# Patient Record
Sex: Female | Born: 1980 | Race: White | Hispanic: No | Marital: Single | Smoking: Former smoker
Health system: Southern US, Community
[De-identification: ages and names within clinical notes are randomized; demographics above are authoritative.]

## PROBLEM LIST (undated history)

## (undated) DIAGNOSIS — R011 Cardiac murmur, unspecified: Secondary | ICD-10-CM

## (undated) DIAGNOSIS — E559 Vitamin D deficiency, unspecified: Secondary | ICD-10-CM

## (undated) DIAGNOSIS — G40909 Epilepsy, unspecified, not intractable, without status epilepticus: Secondary | ICD-10-CM

## (undated) DIAGNOSIS — K602 Anal fissure, unspecified: Secondary | ICD-10-CM

## (undated) DIAGNOSIS — T7840XA Allergy, unspecified, initial encounter: Secondary | ICD-10-CM

## (undated) DIAGNOSIS — D649 Anemia, unspecified: Secondary | ICD-10-CM

## (undated) DIAGNOSIS — R7303 Prediabetes: Secondary | ICD-10-CM

## (undated) DIAGNOSIS — O009 Unspecified ectopic pregnancy without intrauterine pregnancy: Secondary | ICD-10-CM

## (undated) DIAGNOSIS — Z5189 Encounter for other specified aftercare: Secondary | ICD-10-CM

## (undated) HISTORY — DX: Encounter for other specified aftercare: Z51.89

## (undated) HISTORY — DX: Vitamin D deficiency, unspecified: E55.9

## (undated) HISTORY — DX: Prediabetes: R73.03

## (undated) HISTORY — DX: Anal fissure, unspecified: K60.2

## (undated) HISTORY — DX: Allergy, unspecified, initial encounter: T78.40XA

## (undated) HISTORY — DX: Anemia, unspecified: D64.9

## (undated) HISTORY — DX: Unspecified ectopic pregnancy without intrauterine pregnancy: O00.90

---

## 2000-04-14 ENCOUNTER — Emergency Department (HOSPITAL_COMMUNITY): Admission: EM | Admit: 2000-04-14 | Discharge: 2000-04-15 | Payer: Self-pay | Admitting: Emergency Medicine

## 2002-08-11 ENCOUNTER — Other Ambulatory Visit: Admission: RE | Admit: 2002-08-11 | Discharge: 2002-08-11 | Payer: Self-pay | Admitting: Obstetrics and Gynecology

## 2007-11-16 ENCOUNTER — Ambulatory Visit: Payer: Self-pay | Admitting: Family Medicine

## 2009-02-08 ENCOUNTER — Ambulatory Visit: Payer: Self-pay | Admitting: Family Medicine

## 2010-03-22 ENCOUNTER — Ambulatory Visit: Payer: Self-pay | Admitting: Family Medicine

## 2010-03-27 ENCOUNTER — Ambulatory Visit
Admission: RE | Admit: 2010-03-27 | Discharge: 2010-03-27 | Payer: Self-pay | Source: Home / Self Care | Attending: Family Medicine | Admitting: Family Medicine

## 2010-10-29 ENCOUNTER — Other Ambulatory Visit: Payer: Self-pay | Admitting: *Deleted

## 2010-10-29 ENCOUNTER — Telehealth: Payer: Self-pay | Admitting: Family Medicine

## 2010-10-29 ENCOUNTER — Other Ambulatory Visit: Payer: BC Managed Care – PPO

## 2010-10-29 DIAGNOSIS — N390 Urinary tract infection, site not specified: Secondary | ICD-10-CM

## 2010-10-29 DIAGNOSIS — R300 Dysuria: Secondary | ICD-10-CM

## 2010-10-29 LAB — POCT URINALYSIS DIPSTICK
Bilirubin, UA: NEGATIVE
Blood, UA: POSITIVE
Glucose, UA: NEGATIVE
Leukocytes, UA: NEGATIVE
Nitrite, UA: POSITIVE
Urobilinogen, UA: NEGATIVE
pH, UA: 8

## 2010-10-29 MED ORDER — NITROFURANTOIN MONOHYD MACRO 100 MG PO CAPS: 100.0000 mg | ORAL_CAPSULE | Freq: Two times a day (BID) | ORAL | Status: AC

## 2010-10-29 NOTE — Telephone Encounter (Signed)
Pt has UTI and macrobid 100 mg 1 po BID #20 with no refills was called in  to [facility] at [phone].  CM, LPN

## 2010-10-29 NOTE — Telephone Encounter (Signed)
Called pt to inform her she

## 2010-10-29 NOTE — Telephone Encounter (Signed)
She at least needs to bring by a urine specimen

## 2011-02-21 ENCOUNTER — Encounter: Payer: Self-pay | Admitting: Family Medicine

## 2011-03-20 ENCOUNTER — Ambulatory Visit (INDEPENDENT_AMBULATORY_CARE_PROVIDER_SITE_OTHER): Payer: BC Managed Care – PPO | Admitting: Family Medicine

## 2011-03-20 ENCOUNTER — Encounter: Payer: Self-pay | Admitting: Family Medicine

## 2011-03-20 VITALS — BP 100/70 | HR 74 | Ht 64.0 in | Wt 129.0 lb

## 2011-03-20 DIAGNOSIS — N912 Amenorrhea, unspecified: Secondary | ICD-10-CM

## 2011-03-20 LAB — POCT URINE PREGNANCY: Preg Test, Ur: POSITIVE

## 2011-03-20 NOTE — Progress Notes (Signed)
  Subjective:    Patient ID: Courtney Wheeler, female    DOB: 09-Jun-1980, 30 y.o.   MRN: [id number]  HPI She is here for verification of a recent urine pregnancy test at home which was positive. She is normally very regular. She has had difficulty with birth control causing side effects.   Review of Systems     Objective:   Physical Exam Alert and in no distress. Urine hCG test was positive.       Assessment & Plan:  Amenorrhea, probable pregnancy. Serum pregnancy test will be ordered. Discussed followup and she would like to have the pregnancy terminated. We will make appropriate referral when this occurs.

## 2011-03-20 NOTE — Progress Notes (Signed)
Addended by: Lavell Islam on: 03/20/2011 09:48 AM   Modules accepted: Orders

## 2011-03-24 ENCOUNTER — Telehealth: Payer: Self-pay | Admitting: Family Medicine

## 2011-03-24 NOTE — Telephone Encounter (Signed)
Per Dr. Susann Givens contacted patient concerning her request to terminate pregnancy. She states she has scheduled appointment with [facility] on [address] but would prefer to go thru her OBGYN, Dr. Rosemary Holms if possible. Contacted Dr. Damian Leavell office and spoke to his nurse,Tracy, and asked if this was a service he provided or how did they handle. She states they handle these on a case by case basis and schedule the patient to come in to see the physician. Scheduled patient to see Dr. Rosemary Holms on Thursday, January 3,2013 at 2:45. Pt informed of appointment.

## 2011-03-25 DIAGNOSIS — O009 Unspecified ectopic pregnancy without intrauterine pregnancy: Secondary | ICD-10-CM

## 2011-03-25 HISTORY — PX: DILATION AND CURETTAGE OF UTERUS: SHX78

## 2011-03-25 HISTORY — DX: Unspecified ectopic pregnancy without intrauterine pregnancy: O00.90

## 2011-03-27 ENCOUNTER — Telehealth: Payer: Self-pay | Admitting: Family Medicine

## 2011-03-27 NOTE — Telephone Encounter (Signed)
Pt informed

## 2011-03-27 NOTE — Telephone Encounter (Signed)
Courtney Wheeler said it was to late to do this

## 2011-03-27 NOTE — Telephone Encounter (Signed)
Saw Dr. Rosemary Holms yesterday they did not find embryo. Want to add quantative pregnancy test to blood from 12/27 to compare the levels  Please call patient, per patient Dr. Damian Leavell office is suppose to call also

## 2011-03-27 NOTE — Telephone Encounter (Signed)
Let patient know that we could not do this do to technical reasons

## 2011-03-27 NOTE — Telephone Encounter (Signed)
Have Alvino Chapel add this to the bloodwork that we already did

## 2011-03-31 ENCOUNTER — Encounter: Payer: BC Managed Care – PPO | Admitting: Family Medicine

## 2011-04-17 ENCOUNTER — Other Ambulatory Visit: Payer: Self-pay

## 2011-04-17 ENCOUNTER — Encounter: Payer: Self-pay | Admitting: Medical

## 2011-04-17 ENCOUNTER — Inpatient Hospital Stay (HOSPITAL_COMMUNITY)
Admission: EM | Admit: 2011-04-17 | Discharge: 2011-04-19 | DRG: 377 | Disposition: A | Discharge status: Home / Self Care | Payer: BC Managed Care – PPO | Attending: Obstetrics and Gynecology | Admitting: Obstetrics and Gynecology

## 2011-04-17 ENCOUNTER — Encounter (HOSPITAL_COMMUNITY)
Admission: EM | Disposition: A | Discharge status: Home / Self Care | Payer: Self-pay | Source: Home / Self Care | Attending: Obstetrics and Gynecology

## 2011-04-17 ENCOUNTER — Encounter (HOSPITAL_COMMUNITY): Payer: Self-pay | Admitting: *Deleted

## 2011-04-17 ENCOUNTER — Encounter (HOSPITAL_COMMUNITY): Payer: Self-pay | Admitting: Anesthesiology

## 2011-04-17 ENCOUNTER — Emergency Department (HOSPITAL_COMMUNITY): Payer: BC Managed Care – PPO

## 2011-04-17 ENCOUNTER — Other Ambulatory Visit: Payer: Self-pay | Admitting: Obstetrics and Gynecology

## 2011-04-17 ENCOUNTER — Emergency Department (HOSPITAL_COMMUNITY): Payer: BC Managed Care – PPO | Admitting: Anesthesiology

## 2011-04-17 ENCOUNTER — Ambulatory Visit (INDEPENDENT_AMBULATORY_CARE_PROVIDER_SITE_OTHER): Payer: BC Managed Care – PPO | Admitting: Medical

## 2011-04-17 VITALS — BP 90/60 | HR 80 | Temp 98.0°F | Resp 16 | Wt 130.0 lb

## 2011-04-17 DIAGNOSIS — O0384 Damage to pelvic organs following complete or unspecified spontaneous abortion: Secondary | ICD-10-CM

## 2011-04-17 DIAGNOSIS — O009 Unspecified ectopic pregnancy without intrauterine pregnancy: Secondary | ICD-10-CM

## 2011-04-17 DIAGNOSIS — R55 Syncope and collapse: Secondary | ICD-10-CM

## 2011-04-17 DIAGNOSIS — E860 Dehydration: Secondary | ICD-10-CM

## 2011-04-17 DIAGNOSIS — F17200 Nicotine dependence, unspecified, uncomplicated: Secondary | ICD-10-CM | POA: Diagnosis present

## 2011-04-17 DIAGNOSIS — R5383 Other fatigue: Secondary | ICD-10-CM

## 2011-04-17 DIAGNOSIS — K661 Hemoperitoneum: Secondary | ICD-10-CM

## 2011-04-17 DIAGNOSIS — R197 Diarrhea, unspecified: Secondary | ICD-10-CM

## 2011-04-17 DIAGNOSIS — R5381 Other malaise: Secondary | ICD-10-CM

## 2011-04-17 DIAGNOSIS — D649 Anemia, unspecified: Secondary | ICD-10-CM

## 2011-04-17 DIAGNOSIS — IMO0002 Reserved for concepts with insufficient information to code with codable children: Principal | ICD-10-CM | POA: Diagnosis present

## 2011-04-17 DIAGNOSIS — R231 Pallor: Secondary | ICD-10-CM

## 2011-04-17 DIAGNOSIS — Z880 Allergy status to penicillin: Secondary | ICD-10-CM

## 2011-04-17 DIAGNOSIS — D62 Acute posthemorrhagic anemia: Secondary | ICD-10-CM | POA: Diagnosis present

## 2011-04-17 DIAGNOSIS — O99893 Other specified diseases and conditions complicating puerperium: Secondary | ICD-10-CM | POA: Diagnosis present

## 2011-04-17 HISTORY — PX: LAPAROTOMY: SHX154

## 2011-04-17 HISTORY — PX: SALPINGECTOMY: SHX328

## 2011-04-17 HISTORY — DX: Cardiac murmur, unspecified: R01.1

## 2011-04-17 HISTORY — DX: Epilepsy, unspecified, not intractable, without status epilepticus: G40.909

## 2011-04-17 LAB — POCT URINALYSIS DIPSTICK
Glucose, UA: NEGATIVE
Leukocytes, UA: NEGATIVE
Nitrite, UA: NEGATIVE
Urobilinogen, UA: 1

## 2011-04-17 LAB — COMPREHENSIVE METABOLIC PANEL
ALT: 138 U/L — ABNORMAL HIGH (ref 0–35)
AST: 99 U/L — ABNORMAL HIGH (ref 0–37)
Albumin: 3.7 g/dL (ref 3.5–5.2)
CO2: 24 mEq/L (ref 19–32)
Calcium: 9.1 mg/dL (ref 8.4–10.5)
Creatinine, Ser: 0.83 mg/dL (ref 0.50–1.10)
Sodium: 137 mEq/L (ref 135–145)
Total Protein: 7.2 g/dL (ref 6.0–8.3)

## 2011-04-17 LAB — PREPARE RBC (CROSSMATCH)

## 2011-04-17 LAB — URINALYSIS, ROUTINE W REFLEX MICROSCOPIC
Bilirubin Urine: NEGATIVE
Glucose, UA: NEGATIVE mg/dL
Specific Gravity, Urine: 1.006 (ref 1.005–1.030)
pH: 6.5 (ref 5.0–8.0)

## 2011-04-17 LAB — HCG, QUANTITATIVE, PREGNANCY: hCG, Beta Chain, Quant, S: 2193 m[IU]/mL — ABNORMAL HIGH (ref <5)

## 2011-04-17 LAB — DIFFERENTIAL
Basophils Absolute: 0 10*3/uL (ref 0.0–0.1)
Eosinophils Relative: 2 % (ref 0–5)
Lymphocytes Relative: 29 % (ref 12–46)
Lymphs Abs: 2.5 10*3/uL (ref 0.7–4.0)
Monocytes Absolute: 0.9 10*3/uL (ref 0.1–1.0)
Neutro Abs: 5 10*3/uL (ref 1.7–7.7)

## 2011-04-17 LAB — POCT MONO (EPSTEIN BARR VIRUS): Mono, POC: POSITIVE — AB

## 2011-04-17 LAB — CBC WITH DIFFERENTIAL/PLATELET
Basophils Absolute: 0 10*3/uL (ref 0.0–0.1)
Basophils Relative: 0 % (ref 0–1)
HCT: 13.8 % — CL (ref 36.0–46.0)
Lymphocytes Relative: 22 % (ref 12–46)
MCHC: 32.1 g/dL (ref 30.0–36.0)
Neutro Abs: 4.5 10*3/uL (ref 1.7–7.7)
Neutrophils Relative %: 62 % (ref 43–77)
RDW: 16.4 % — ABNORMAL HIGH (ref 11.5–15.5)
WBC: 7.3 10*3/uL (ref 4.0–10.5)

## 2011-04-17 LAB — HEPATIC FUNCTION PANEL
AST: 85 U/L — ABNORMAL HIGH (ref 0–37)
Albumin: 3.8 g/dL (ref 3.5–5.2)
Bilirubin, Direct: 0.4 mg/dL — ABNORMAL HIGH (ref 0.0–0.3)
Total Bilirubin: 1.4 mg/dL — ABNORMAL HIGH (ref 0.3–1.2)

## 2011-04-17 LAB — TSH: TSH: 1.501 u[IU]/mL (ref 0.350–4.500)

## 2011-04-17 LAB — BASIC METABOLIC PANEL
CO2: 24 mEq/L (ref 19–32)
Calcium: 8.6 mg/dL (ref 8.4–10.5)
Creat: 0.74 mg/dL (ref 0.50–1.10)
Glucose, Bld: 124 mg/dL — ABNORMAL HIGH (ref 70–99)
Sodium: 135 mEq/L (ref 135–145)

## 2011-04-17 LAB — CBC
HCT: 14.4 % — ABNORMAL LOW (ref 36.0–46.0)
Hemoglobin: 5.1 g/dL — CL (ref 12.0–15.0)
MCV: 98.6 fL (ref 78.0–100.0)
RBC: 1.46 MIL/uL — ABNORMAL LOW (ref 3.87–5.11)
RDW: 16.6 % — ABNORMAL HIGH (ref 11.5–15.5)
WBC: 8.7 10*3/uL (ref 4.0–10.5)

## 2011-04-17 LAB — URINE MICROSCOPIC-ADD ON

## 2011-04-17 SURGERY — UNILATERAL SALPINGECTOMY
Anesthesia: General | Site: Abdomen | Laterality: Right | Wound class: Clean

## 2011-04-17 MED ORDER — NEOSTIGMINE METHYLSULFATE 1 MG/ML IJ SOLN
INTRAMUSCULAR | Status: DC | PRN
  Administered 2011-04-17: 4 mg via INTRAVENOUS

## 2011-04-17 MED ORDER — LACTATED RINGERS IV SOLN: INTRAVENOUS | Status: DC

## 2011-04-17 MED ORDER — ONDANSETRON HCL 4 MG/2ML IJ SOLN
INTRAMUSCULAR | Status: DC | PRN
  Administered 2011-04-17: 4 mg via INTRAVENOUS

## 2011-04-17 MED ORDER — ROCURONIUM BROMIDE 100 MG/10ML IV SOLN
INTRAVENOUS | Status: DC | PRN
  Administered 2011-04-17: 10 mg via INTRAVENOUS

## 2011-04-17 MED ORDER — BUPIVACAINE HCL (PF) 0.25 % IJ SOLN
INTRAMUSCULAR | Status: AC
  Filled 2011-04-17: qty 30

## 2011-04-17 MED ORDER — SODIUM CHLORIDE 0.9 % IV BOLUS (SEPSIS)
1000.0000 mL | Freq: Once | INTRAVENOUS | Status: AC
  Administered 2011-04-17: 50 mL via INTRAVENOUS
  Administered 2011-04-17: 1000 mL via INTRAVENOUS

## 2011-04-17 MED ORDER — CLINDAMYCIN PHOSPHATE 900 MG/50ML IV SOLN
900.0000 mg | INTRAVENOUS | Status: AC
  Administered 2011-04-17: 900 mg via INTRAVENOUS
  Filled 2011-04-17: qty 50

## 2011-04-17 MED ORDER — BUPIVACAINE HCL 0.25 % IJ SOLN
INTRAMUSCULAR | Status: DC | PRN
  Administered 2011-04-17: 3 mL

## 2011-04-17 MED ORDER — MIDAZOLAM HCL 5 MG/5ML IJ SOLN
INTRAMUSCULAR | Status: DC | PRN
  Administered 2011-04-17: 2 mg via INTRAVENOUS

## 2011-04-17 MED ORDER — PROMETHAZINE HCL 25 MG/ML IJ SOLN: 6.2500 mg | INTRAMUSCULAR | Status: DC | PRN

## 2011-04-17 MED ORDER — GLYCOPYRROLATE 0.2 MG/ML IJ SOLN
INTRAMUSCULAR | Status: DC | PRN
  Administered 2011-04-17: .5 mg via INTRAVENOUS

## 2011-04-17 MED ORDER — KETOROLAC TROMETHAMINE 30 MG/ML IJ SOLN
30.0000 mg | Freq: Four times a day (QID) | INTRAMUSCULAR | Status: DC
  Filled 2011-04-17 (×4): qty 1

## 2011-04-17 MED ORDER — SODIUM CHLORIDE 0.9 % IV SOLN
INTRAVENOUS | Status: DC
  Administered 2011-04-17: 23:00:00 via INTRAVENOUS

## 2011-04-17 MED ORDER — SODIUM CHLORIDE 0.9 % IV SOLN
INTRAVENOUS | Status: DC | PRN
  Administered 2011-04-17: 23:00:00 via INTRAVENOUS

## 2011-04-17 MED ORDER — SODIUM CHLORIDE 0.9 % IV BOLUS (SEPSIS)
1000.0000 mL | Freq: Once | INTRAVENOUS | Status: AC
  Administered 2011-04-17: 1000 mL via INTRAVENOUS
  Administered 2011-04-17: 700 mL via INTRAVENOUS

## 2011-04-17 MED ORDER — KETOROLAC TROMETHAMINE 30 MG/ML IJ SOLN
30.0000 mg | Freq: Four times a day (QID) | INTRAMUSCULAR | Status: DC
  Administered 2011-04-17 – 2011-04-19 (×7): 30 mg via INTRAVENOUS
  Filled 2011-04-17 (×10): qty 1

## 2011-04-17 MED ORDER — DEXTROSE IN LACTATED RINGERS 5 % IV SOLN: INTRAVENOUS | Status: DC

## 2011-04-17 MED ORDER — SUCCINYLCHOLINE CHLORIDE 20 MG/ML IJ SOLN
INTRAMUSCULAR | Status: DC | PRN
  Administered 2011-04-17: 100 mg via INTRAVENOUS

## 2011-04-17 MED ORDER — HYDROMORPHONE HCL PF 1 MG/ML IJ SOLN
INTRAMUSCULAR | Status: AC
  Administered 2011-04-17: 0.5 mg via INTRAVENOUS
  Filled 2011-04-17: qty 1

## 2011-04-17 MED ORDER — HYDROMORPHONE 0.3 MG/ML IV SOLN
INTRAVENOUS | Status: DC
  Administered 2011-04-18: 0.879 mg via INTRAVENOUS
  Administered 2011-04-18: 1.28 mg via INTRAVENOUS
  Administered 2011-04-18: via INTRAVENOUS
  Administered 2011-04-18: 0.6 mg via INTRAVENOUS
  Administered 2011-04-18: 1.39 mg via INTRAVENOUS
  Administered 2011-04-18: 08:00:00 via INTRAVENOUS
  Filled 2011-04-17: qty 25

## 2011-04-17 MED ORDER — KETOROLAC TROMETHAMINE 30 MG/ML IJ SOLN
INTRAMUSCULAR | Status: AC
  Administered 2011-04-17: 30 mg via INTRAVENOUS
  Filled 2011-04-17: qty 1

## 2011-04-17 MED ORDER — HYDROMORPHONE HCL PF 1 MG/ML IJ SOLN: 0.2500 mg | INTRAMUSCULAR | Status: DC | PRN

## 2011-04-17 MED ORDER — FENTANYL CITRATE 0.05 MG/ML IJ SOLN
INTRAMUSCULAR | Status: DC | PRN
  Administered 2011-04-17: 100 ug via INTRAVENOUS

## 2011-04-17 MED ORDER — IOHEXOL 300 MG/ML  SOLN
100.0000 mL | Freq: Once | INTRAMUSCULAR | Status: AC | PRN
  Administered 2011-04-17: 100 mL via INTRAVENOUS

## 2011-04-17 MED ORDER — HYDROMORPHONE HCL PF 1 MG/ML IJ SOLN
0.2000 mg | INTRAMUSCULAR | Status: DC | PRN
  Administered 2011-04-17: 0.5 mg via INTRAVENOUS

## 2011-04-17 MED ORDER — LACTATED RINGERS IV SOLN
INTRAVENOUS | Status: DC | PRN
  Administered 2011-04-17: 23:00:00 via INTRAVENOUS

## 2011-04-17 MED ORDER — ETOMIDATE 2 MG/ML IV SOLN
INTRAVENOUS | Status: DC | PRN
  Administered 2011-04-17: 20 mg via INTRAVENOUS

## 2011-04-17 MED ORDER — ONDANSETRON HCL 4 MG/2ML IJ SOLN
4.0000 mg | Freq: Once | INTRAMUSCULAR | Status: AC
  Administered 2011-04-17: 4 mg via INTRAVENOUS
  Filled 2011-04-17: qty 2

## 2011-04-17 MED ORDER — CIPROFLOXACIN IN D5W 400 MG/200ML IV SOLN
400.0000 mg | INTRAVENOUS | Status: AC
  Administered 2011-04-17: 400 mg via INTRAVENOUS
  Filled 2011-04-17: qty 200

## 2011-04-17 SURGICAL SUPPLY — 31 items
BAG URINE DRAINAGE (UROLOGICAL SUPPLIES) IMPLANT
CANISTER SUCTION 2500CC (MISCELLANEOUS) IMPLANT
CLOTH BEACON ORANGE TIMEOUT ST (SAFETY) ×4 IMPLANT
DECANTER SPIKE VIAL GLASS SM (MISCELLANEOUS) ×4 IMPLANT
DRAPE LAPAROSCOPIC ABDOMINAL (DRAPES) ×4 IMPLANT
DRAPE WARM FLUID 44X44 (DRAPE) ×4 IMPLANT
DRSG COVERLET 3X3 (GAUZE/BANDAGES/DRESSINGS) ×4 IMPLANT
ELECT REM PT RETURN 9FT ADLT (ELECTROSURGICAL) ×4
ELECTRODE REM PT RTRN 9FT ADLT (ELECTROSURGICAL) ×3 IMPLANT
GAUZE SPONGE 4X4 16PLY XRAY LF (GAUZE/BANDAGES/DRESSINGS) IMPLANT
GLOVE BIO SURGEON STRL SZ 6.5 (GLOVE) ×8 IMPLANT
GLOVE BIO SURGEON STRL SZ7 (GLOVE) ×8 IMPLANT
GOWN STRL REIN XL XLG (GOWN DISPOSABLE) ×12 IMPLANT
MANIFOLD NEPTUNE II (INSTRUMENTS) ×4 IMPLANT
NS IRRIG 1000ML POUR BTL (IV SOLUTION) ×12 IMPLANT
PACK ABDOMINAL WL (CUSTOM PROCEDURE TRAY) ×4 IMPLANT
SPONGE LAP 18X18 X RAY DECT (DISPOSABLE) ×4 IMPLANT
SUCTION POOLE TIP (SUCTIONS) ×4 IMPLANT
SUT PROLENE 0 CT 1 30 (SUTURE) IMPLANT
SUT VIC AB 0 CT1 18XCR BRD 8 (SUTURE) IMPLANT
SUT VIC AB 0 CT1 27 (SUTURE) ×1
SUT VIC AB 0 CT1 27XBRD ANTBC (SUTURE) ×3 IMPLANT
SUT VIC AB 0 CT1 8-18 (SUTURE)
SUT VIC AB 2-0 SH 27 (SUTURE) ×1
SUT VIC AB 2-0 SH 27X BRD (SUTURE) ×3 IMPLANT
SUT VIC AB 3-0 SH 18 (SUTURE) ×4 IMPLANT
SUT VIC AB 4-0 SH 18 (SUTURE) IMPLANT
SUT VICRYL 0 TIES 12 18 (SUTURE) ×8 IMPLANT
SYR CONTROL 10ML LL (SYRINGE) ×4 IMPLANT
TOWEL OR 17X26 10 PK STRL BLUE (TOWEL DISPOSABLE) ×8 IMPLANT
WATER STERILE IRR 1500ML POUR (IV SOLUTION) IMPLANT

## 2011-04-17 NOTE — Patient Instructions (Addendum)
We will get labs today.  Some will be back in a few hours, some tomorrow.    For the next 24 hours, I want you to drink at least 500 ml of water every hour until you are urinating clear liquid.   Also, I want you to drink 16 oz of Gatorade every hour as well to help supplement the electrolytes.    REST!!!!  Don't be doing anything active.  Just take it easy.    Try using B.R.A.T. Diet - bananas, rice, applesauce, toast.    If you use the Phenergan, try just 1/ 2 tablet.    If much worse, unable to urinate, or unable to keep anything down, go to the emergency department.     Dehydration Dehydration is the reduction of water and fluid from the body to a level below that required for proper functioning. CAUSES  Dehydration occurs when there is excessive fluid loss from the body or when loss of normal fluids is not adequately replaced.  Loss of fluids occurs in vomiting, diarrhea, excessive sweating, excessive urine output, or excessive loss of fluid from the lungs (as occurs in fever or in patients on a ventilator).   Inadequate fluid replacement occurs with nausea or decreased appetite due to illness, sore throat, or mouth pain.  SYMPTOMS  Mild dehydration  Thirst (infants and young children may not be able to tell you they are thirsty).   Dry lips.   Slightly dry mouth membranes.  Moderate dehydration  Very dry mouth membranes.   Sunken eyes.   Sunken soft spot (fontanelle) on infant's head.   Skin does not bounce back quickly when lightly pinched and released.   Decreased urine production.   Decreased tear production.  Severe dehydration  Rapid, weak pulse (more than 100 beats per minute at rest).   Cold hands and feet.   Loss of ability to sweat in spite of heat and temperature.   Rapid breathing.   Blue lips.   Confusion, lethargy, difficult to arouse.   Minimal urine production.   No tears.  DIAGNOSIS  Your caregiver will diagnose dehydration based on  your symptoms and your exam. Blood and urine tests will help confirm the diagnosis. The diagnostic evaluation should also identify the cause of dehydration. PREVENTION  The body depends on a proper balance of fluid and salts (electrolytes) for normal function. Adequate fluid intake in the presence of illness or other stresses (such as extreme exercise) is important.  TREATMENT   Mild dehydration is safe to self-treat for most ages as long as it does not worsen. Contact your caregiver for even mild dehydration in infants and the elderly.   In teenagers and adults with moderate dehydration, careful home treatment (as outlined below) can be safe. Phone contact with a caregiver is advised. Children under 79 years of age with moderate dehydration should see a caregiver.   If you or your child is severely dehydrated, go to a hospital for treatment. Intravenous (IV) fluids will quickly reverse dehydration and are often lifesaving in young children, infants, and elderly persons.  HOME CARE INSTRUCTIONS  Small amounts of fluids should be taken frequently. Large amounts at one time may not be tolerated. Plain water may be harmful in infants and the elderly. Oral rehydration solutions (ORS) are available at pharmacies and grocery stores. ORS replaces water and important electrolytes in proper proportions. Sports drinks are not as effective as ORS and may be harmful because the sugar can make diarrhea worse.  As a general guideline for children, replace any new fluid losses from diarrhea and/or vomiting with ORS as follows:   If your child weighs 22 pounds or under (10 kg or less), give 60-120 mL (1/4-1/2 cup or 2-4 ounces) of ORS for each diarrheal stool or vomiting episode.   If your child weighs more than 22 pounds (more than 10 kg), give 120-240 mL (1/2-1 cup or 4-8 ounces) of ORS for each diarrheal stool or vomiting episode.   If your child is vomiting, it may be helpful to give the above ORS replacement  in 5 mL (1 teaspoon) amounts every 5 minutes and increase as tolerated.   While correcting for dehydration, children should eat normally. However, foods high in sugar should be avoided because they may worsen diarrhea. Large amounts of carbonated soft drinks, juice, gelatin desserts, and other highly sugared drinks should be avoided.   After correction of dehydration, other liquids that are appealing to the child may be added. Children should drink small amounts of fluids frequently and fluids should be increased as tolerated. Children should drink enough fluids to keep urine clear or pale yellow.   Adults should eat normally while drinking more fluids than usual. Drink small amounts of fluids frequently and increase the amount as tolerated. Drink enough fluids to keep urine clear or pale yellow. Broths, weak decaffeinated tea, lemon-lime soft drinks (allowed to go flat), and ORS replace fluids and electrolytes.   Avoid:   Carbonated drinks.   Juice.   Extremely hot or cold fluids.   Caffeine drinks.   Fatty, greasy foods.   Alcohol.   Tobacco.   Too much intake of anything at one time.   Gelatin desserts.   Probiotics are active cultures of beneficial bacteria. They may lessen the amount and number of diarrheal stools in adults. Probiotics can be found in yogurt with active cultures and in supplements.   Wash your hands well to avoid spreading germs (bacteria) and viruses.   Antidiarrheal medicines are not recommended for infants and children.   Only take over-the-counter or prescription medicines for pain, discomfort, or fever as directed by your caregiver. Do not give aspirin to children.   For adults with dehydration, ask your caregiver if you should continue all prescribed and over-the-counter medicines.   If your caregiver has given you a follow-up appointment, it is very important to keep that appointment. Not keeping the appointment could result in a lasting (chronic) or  permanent injury and disability. If there is any problem keeping the appointment, you must call to reschedule.  SEEK IMMEDIATE MEDICAL CARE IF:   You are unable to keep fluids down or other symptoms become worse despite treatment.   Vomiting or diarrhea develops and becomes persistent.   There is vomiting of blood or green matter (bile).   There is blood in the stool or the stools are black and tarry.   There is no urine output in 6 to 8 hours or there is only a small amount of very dark urine.   Abdominal pain develops, increases, or localizes.   You or your child has an oral temperature above 102 F (38.9 C), not controlled by medicine.   Your baby is older than 3 months with a rectal temperature of 102.66F (38.9 C) or higher.   Your baby is 37 months old or younger with a rectal temperature of 100.4 F (38 C) or higher.   You develop excessive weakness, dizziness, fainting, or extreme thirst.  You develop a rash, stiff neck, severe headache, or you become irritable, sleepy, or difficult to awaken.  MAKE SURE YOU:   Understand these instructions.   Will watch your condition.   Will get help right away if you are not doing well or get worse.  Document Released: 03/10/2005 Document Revised: 09/23/2010 Document Reviewed: 02/06/2009 [facility] Patient Information 2012 [place], [place].

## 2011-04-17 NOTE — ED Notes (Signed)
MD at bedside. 

## 2011-04-17 NOTE — Preoperative (Signed)
Beta Blockers   Reason not to administer Beta Blockers:Not Applicable 

## 2011-04-17 NOTE — H&P (Signed)
Courtney Wheeler is an 31 y.o. female.[id number] WF presents to ER for evaluation of feeling weak s/p termination of pregnancy last Saturday at clinic. Eval in ER showed intraperitoneal bleeding c/w uterine rupture  Pertinent Gynecological History: Menses: flow is moderate Bleeding: normal Contraception: none DES exposure: unknown Blood transfusions: 3 Unit Sexually transmitted diseases: no past history Previous GYN Procedures: DNC  Last mammogram: none Date:  Last pap: normal Date:  OB History: G2, P0020   Menstrual History: Menarche age: Patient's last menstrual period was 02/16/2011.    Past Medical History  Diagnosis Date  . Allergy     RHINITIS  . Heart murmur   . Seizure disorder     Past Surgical History  Procedure Date  . Dilation and curettage of uterus     Family History  Problem Relation Age of Onset  . Mental illness Maternal Aunt   . Heart disease Maternal Uncle   . Diabetes Maternal Grandmother   . Hypertension Maternal Grandmother   . Arthritis Paternal Grandmother   . Asthma Paternal Grandmother   . Birth defects Paternal Grandmother   . Hypertension Paternal Grandmother   . Cancer Paternal Grandfather   . Heart disease Paternal Grandfather   . Hypertension Paternal Grandfather   . Stroke Paternal Grandfather     Social History:  reports that she has been smoking.  She has never used smokeless tobacco. Her alcohol and drug histories not on file.  Allergies:  Allergies  Allergen Reactions  . Penicillins      (Not in a hospital admission)  ROS see ER visit note  Blood pressure 94/55, pulse 95, temperature 98.5 F (36.9 C), temperature source Oral, resp. rate 15, last menstrual period 02/16/2011, SpO2 98.00%. Physical Exam  Constitutional: She is oriented to person, place, and time. She appears well-developed and well-nourished.  HENT:  Head: Normocephalic.  Cardiovascular: Normal rate and regular rhythm.   GI: Soft.  Neurological: She  is oriented to person, place, and time.  Psychiatric: She has a normal mood and affect.    Results for orders placed during the hospital encounter of 04/17/11 (from the past 24 hour(s))  CBC     Status: Abnormal   Collection Time   04/17/11  8:10 PM      Component Value Range   WBC 8.7  4.0 - 10.5 (K/uL)   RBC 1.46 (*) 3.87 - 5.11 (MIL/uL)   Hemoglobin 5.1 (*) 12.0 - 15.0 (g/dL)   HCT 45.4 (*) 09.8 - 46.0 (%)   MCV 98.6  78.0 - 100.0 (fL)   MCH 32.9  26.0 - 34.0 (pg)   MCHC 33.3  30.0 - 36.0 (g/dL)   RDW 11.9 (*) 14.7 - 15.5 (%)   Platelets 261  150 - 400 (K/uL)  DIFFERENTIAL     Status: Normal   Collection Time   04/17/11  8:10 PM      Component Value Range   Neutrophils Relative 58  43 - 77 (%)   Neutro Abs 5.0  1.7 - 7.7 (K/uL)   Lymphocytes Relative 29  12 - 46 (%)   Lymphs Abs 2.5  0.7 - 4.0 (K/uL)   Monocytes Relative 10  3 - 12 (%)   Monocytes Absolute 0.9  0.1 - 1.0 (K/uL)   Eosinophils Relative 2  0 - 5 (%)   Eosinophils Absolute 0.2  0.0 - 0.7 (K/uL)   Basophils Relative 1  0 - 1 (%)   Basophils Absolute 0.0  0.0 - 0.1 (  K/uL)  COMPREHENSIVE METABOLIC PANEL     Status: Abnormal   Collection Time   04/17/11  8:10 PM      Component Value Range   Sodium 137  135 - 145 (mEq/L)   Potassium 3.2 (*) 3.5 - 5.1 (mEq/L)   Chloride 102  96 - 112 (mEq/L)   CO2 24  19 - 32 (mEq/L)   Glucose, Bld 109 (*) 70 - 99 (mg/dL)   BUN 11  6 - 23 (mg/dL)   Creatinine, Ser 1.61  0.50 - 1.10 (mg/dL)   Calcium 9.1  8.4 - 09.6 (mg/dL)   Total Protein 7.2  6.0 - 8.3 (g/dL)   Albumin 3.7  3.5 - 5.2 (g/dL)   AST 99 (*) 0 - 37 (U/L)   ALT 138 (*) 0 - 35 (U/L)   Alkaline Phosphatase 42  39 - 117 (U/L)   Total Bilirubin 1.4 (*) 0.3 - 1.2 (mg/dL)   GFR calc non Af Amer >90  >90 (mL/min)   GFR calc Af Amer >90  >90 (mL/min)  TYPE AND SCREEN     Status: Normal (Preliminary result)   Collection Time   04/17/11  8:27 PM      Component Value Range   ABO/RH(D) A POS     Antibody Screen NEG      Sample Expiration 04/20/2011     Unit Number [id number]     Blood Component Type RBC CPDA1, LR     Unit division 00     Status of Unit ISSUED     Transfusion Status OK TO TRANSFUSE     Crossmatch Result COMPATIBLE     Unit Number [id number]     Blood Component Type RBC CPDA1, LR     Unit division 00     Status of Unit DISCARDED     Transfusion Status OK TO TRANSFUSE     Crossmatch Result COMPATIBLE     Unit Number [id number]     Blood Component Type RED CELLS,LR     Unit division 00     Status of Unit ISSUED     Transfusion Status OK TO TRANSFUSE     Crossmatch Result COMPATIBLE     Unit Number [id number]     Blood Component Type RED CELLS,LR     Unit division 00     Status of Unit ISSUED     Transfusion Status OK TO TRANSFUSE     Crossmatch Result COMPATIBLE    PREPARE RBC (CROSSMATCH)     Status: Normal   Collection Time   04/17/11  8:30 PM      Component Value Range   Order Confirmation ORDER PROCESSED BY BLOOD BANK    HCG, QUANTITATIVE, PREGNANCY     Status: Abnormal   Collection Time   04/17/11  8:36 PM      Component Value Range   hCG, Beta Chain, Quant, S 2193 (*) <5 (mIU/mL)  ABO/RH     Status: Normal (Preliminary result)   Collection Time   04/17/11  9:00 PM      Component Value Range   ABO/RH(D) A POS    PREPARE RBC (CROSSMATCH)     Status: Normal   Collection Time   04/17/11 10:00 PM      Component Value Range   Order Confirmation ORDER PROCESSED BY BLOOD BANK      Ct Abdomen Pelvis W Contrast  04/17/2011  *RADIOLOGY REPORT*  Clinical Data: Possible retroperitoneal bleeding.  Patient status post abortion 5 days  ago.  CT ABDOMEN AND PELVIS WITH CONTRAST  Technique:  Multidetector CT imaging of the abdomen and pelvis was performed following the standard protocol during bolus administration of intravenous contrast.  Contrast: OMNIPAQUE IOHEXOL 300 MG/ML IV SOLN  Comparison: None.  Findings: The lung bases are clear.  No pleural or pericardial effusion.  There is  a large volume of hemorrhage throughout the abdomen.  The uterus appears ruptured, best visualized in the fundus.  Adnexa are not visible.  Foley catheter is in place.  The liver, spleen, adrenal glands, pancreas, gallbladder and kidneys appear normal.  There is no lymphadenopathy.  No focal bony abnormality.  IMPRESSION: Large volume of intra-abdominal and pelvic hemorrhage due to rupture of the uterus. Critical Value/emergent results were called by telephone at the time of interpretation on 04/17/2011  at 9:22 p.m.  to  Dr. Lynelle Doctor, who verbally acknowledged these results.  Original Report Authenticated By: Bernadene Bell. Maricela Curet, M.D.    Assessment/Plan: Uterine perforation/rupture S/p elective abortion Severe anemia 2nd to acute blood loss P) exp laparotomy, repair of uterus, possible hysterectomy. Procedure explained. Risk of surgery includes infection, bleeding, poss loss of uterus with preservation, injury to surrounding organ structure    Monia Timmers A 04/17/2011, 10:12 PM

## 2011-04-17 NOTE — Anesthesia Preprocedure Evaluation (Signed)
Anesthesia Evaluation  Patient identified by MRN, date of birth, ID band Patient awake    Reviewed: Allergy & Precautions, H&P , NPO status , Patient's Chart, lab work & pertinent test results, reviewed documented beta blocker date and time   Airway Mallampati: II TM Distance: >3 FB Neck ROM: full    Dental No notable dental hx. (+) Teeth Intact and Dental Advidsory Given   Pulmonary neg pulmonary ROS,  clear to auscultation  Pulmonary exam normal       Cardiovascular Exercise Tolerance: Good neg cardio ROS + Valvular Problems/Murmurs regular Normal    Neuro/Psych Negative Neurological ROS  Negative Psych ROS   GI/Hepatic negative GI ROS, Neg liver ROS,   Endo/Other  Negative Endocrine ROS  Renal/GU negative Renal ROS  Genitourinary negative   Musculoskeletal   Abdominal Normal abdominal exam  (+)   Peds  Hematology negative hematology ROS (+)   Anesthesia Other Findings   Reproductive/Obstetrics negative OB ROS                           Anesthesia Physical Anesthesia Plan  ASA: III and Emergent  Anesthesia Plan: General, Rapid Sequence and Cricoid Pressure   Post-op Pain Management:    Induction:   Airway Management Planned:   Additional Equipment:   Intra-op Plan:   Post-operative Plan:   Informed Consent: I have reviewed the patients History and Physical, chart, labs and discussed the procedure including the risks, benefits and alternatives for the proposed anesthesia with the patient or authorized representative who has indicated his/her understanding and acceptance.   Dental Advisory Given  Plan Discussed with: CRNA  Anesthesia Plan Comments:         Anesthesia Quick Evaluation

## 2011-04-17 NOTE — ED Notes (Signed)
Pt had an abortion last Saturday, today at primary Dr hem of 4.4, jaundice and dehydrated, positive for mono. Pt from [facility]

## 2011-04-17 NOTE — ED Provider Notes (Addendum)
History     CSN: [id number]  Arrival date & time 04/17/11  4098   First MD Initiated Contact with Patient 04/17/11 2006      Chief Complaint  Patient presents with  . Abnormal Lab  . Mononucleosis     (Consider location/radiation/quality/duration/timing/severity/associated sxs/prior treatment) HPI  Patient relates she is G1 P0 Ab1, she states she had an abortion done 5 days ago. Her GYN is Dr. Rosemary Holms and he [facility] [address]. Her procedure was done by Dr. Okey Dupre. She relates she was [redacted] weeks pregnant and had minimal spotting for 2 days. She initially denied abdominal pain to me but later on in the course of her interview she relates she had severe pain later that night and the next day with pain radiating up to her shoulders. She relates the day before she started having vomiting and diarrhea and had to get extra fluids when she had her abortion done. She relates she had continued vomiting for 2 days after the procedure and the vomiting has now left. She's been eating some food now. She has been very lightheaded and her family states she's passing out every time she stands up. She went to see her doctor this afternoon and had a positive mono test and she was called at 7 PM and told to the ER for a hemoglobin of 4.4. Her sister was in ER earlier today for splenomegaly and positive mono test. Patient states she attempted to donate blood 2 weeks ago and her hemoglobin was 11. Patient relates some chest discomfort shortly after arriving to the ER. Family reports today she started looking yellow in color.  PCP Dr. Susann Givens GYN Dr. Lockie Mola  Past Medical History  Diagnosis Date  . Allergy     RHINITIS  . Heart murmur    vasovagal syncope  Past Surgical History  Procedure Date  . Dilation and curettage of uterus     Family History  Problem Relation Age of Onset  . Mental illness Maternal Aunt   . Heart disease Maternal Uncle   . Diabetes Maternal Grandmother   .  Hypertension Maternal Grandmother   . Arthritis Paternal Grandmother   . Asthma Paternal Grandmother   . Birth defects Paternal Grandmother   . Hypertension Paternal Grandmother   . Cancer Paternal Grandfather   . Heart disease Paternal Grandfather   . Hypertension Paternal Grandfather   . Stroke Paternal Grandfather     History  Substance Use Topics  . Smoking status: Current Some Day Smoker  . Smokeless tobacco: Never Used  . Alcohol Use: yes  Employed  OB History    Grav Para Term Preterm Abortions TAB SAB Ect Mult Living                  Review of Systems  All other systems reviewed and are negative.    Allergies  Penicillins  Home Medications   Current Outpatient Rx  Name Route Sig Dispense Refill  . IBUPROFEN 200 MG PO TABS Oral Take 200-600 mg by mouth every 6 (six) hours as needed. For pain    . KETOROLAC TROMETHAMINE 10 MG PO TABS Oral Take 10 mg by mouth every 6 (six) hours as needed. For pain    . NORETHIN ACE-ETH ESTRAD-FE 1-20 MG-MCG PO TABS Oral Take 1 tablet by mouth daily.    Marland Kitchen PROMETHAZINE HCL 25 MG PO TABS Oral Take 25 mg by mouth every 6 (six) hours as needed. For nausea  BP 90/60  Pulse 107  Temp(Src) 98.8 F (37.1 C) (Oral)  Resp 24  SpO2 100%  LMP 02/16/2011  Vital signs normal except tachycardia, hypotension   Physical Exam  Nursing note and vitals reviewed. Constitutional: She appears well-developed and well-nourished.  Non-toxic appearance. She does not appear ill.       Patient very pale, looks like she feels bad. Patient has some problems with mentation, her mother gives a lot of her history.  HENT:  Head: Normocephalic and atraumatic.  Right Ear: External ear normal.  Left Ear: External ear normal.  Nose: Nose normal. No mucosal edema or rhinorrhea.  Mouth/Throat: Mucous membranes are normal. No dental abscesses or uvula swelling.       Mucous membranes are dry, mouth is very pale and slightly yellow in color  Eyes:  Conjunctivae and EOM are normal. Pupils are equal, round, and reactive to light.       Sclerae are  very pale  Neck: Normal range of motion and full passive range of motion without pain. Neck supple.  Cardiovascular: Normal rate, regular rhythm and normal heart sounds.  Exam reveals no gallop and no friction rub.   No murmur heard. Pulmonary/Chest: Effort normal and breath sounds normal. No respiratory distress. She has no wheezes. She has no rhonchi. She has no rales. She exhibits no tenderness and no crepitus.  Abdominal: Soft. Normal appearance and bowel sounds are normal. She exhibits no distension. There is no tenderness. There is no rebound and no guarding.  Musculoskeletal: Normal range of motion. She exhibits no edema and no tenderness.       Moves all extremities well.   Neurological: She is alert. She has normal strength. No cranial nerve deficit.  Skin: Skin is warm, dry and intact. No rash noted. No erythema. There is pallor.  Psychiatric: Her speech is normal and behavior is normal. Her mood appears not anxious.       Flat affect    ED Course  [place] bedside Date/Time: 04/17/2011 8:25 PM Performed by: Cera Rorke L Authorized by: Lynelle Doctor, Quaneisha Hanisch L Consent: Verbal consent obtained. Risks and benefits: risks, benefits and alternatives were discussed Consent given by: patient Patient understanding: patient states understanding of the procedure being performed Patient identity confirmed: verbally with patient, arm band and hospital-assigned identification number Time out: Immediately prior to procedure a "time out" was called to verify the correct patient, procedure, equipment, support staff and site/side marked as required. Comments: FAST was performed,she had a  Large amount of  free fluid seen in the right upper quadrant, small amount seen in the left upper quadrant. No obvious free fluid seen in the pelvic view. Patient unable to tolerate subxiphoid view and became diaphoretic and  nauseated with tachycardia and said she was having pain. Parasternal long was performed with no pericardial effusion seen. Views were archived.    (including critical care time) Pt started on IV fluids and started on 0- blood while waiting for type and cross to be performed 20:46 Dr Gerrit Friends states to do CT scan, family and patient informed 21:21 Radiologist called CT report ruptured uterus family and patient informed 21:31 Dr Cherly Hensen will come see patient family and patient informed.  2134 Pt BP improved, HR improved, starting her second unit of PRC's,type specific, 3rd unit will be hung shortly. Pt has improving color.  21:48 Dr Cherly Hensen here and is discussing surgery with patient and mother.  Results for orders placed during the hospital encounter of 04/17/11  CBC  Component Value Range   WBC 8.7  4.0 - 10.5 (K/uL)   RBC 1.46 (*) 3.87 - 5.11 (MIL/uL)   Hemoglobin 5.1 (*) 12.0 - 15.0 (g/dL)   HCT 16.1 (*) 09.6 - 46.0 (%)   MCV 98.6  78.0 - 100.0 (fL)   MCH 32.9  26.0 - 34.0 (pg)   MCHC 33.3  30.0 - 36.0 (g/dL)   RDW 04.5 (*) 40.9 - 15.5 (%)   Platelets 261  150 - 400 (K/uL)  DIFFERENTIAL      Component Value Range   Neutrophils Relative 58  43 - 77 (%)   Neutro Abs 5.0  1.7 - 7.7 (K/uL)   Lymphocytes Relative 29  12 - 46 (%)   Lymphs Abs 2.5  0.7 - 4.0 (K/uL)   Monocytes Relative 10  3 - 12 (%)   Monocytes Absolute 0.9  0.1 - 1.0 (K/uL)   Eosinophils Relative 2  0 - 5 (%)   Eosinophils Absolute 0.2  0.0 - 0.7 (K/uL)   Basophils Relative 1  0 - 1 (%)   Basophils Absolute 0.0  0.0 - 0.1 (K/uL)  COMPREHENSIVE METABOLIC PANEL      Component Value Range   Sodium 137  135 - 145 (mEq/L)   Potassium 3.2 (*) 3.5 - 5.1 (mEq/L)   Chloride 102  96 - 112 (mEq/L)   CO2 24  19 - 32 (mEq/L)   Glucose, Bld 109 (*) 70 - 99 (mg/dL)   BUN 11  6 - 23 (mg/dL)   Creatinine, Ser 8.11  0.50 - 1.10 (mg/dL)   Calcium 9.1  8.4 - 91.4 (mg/dL)   Total Protein 7.2  6.0 - 8.3 (g/dL)   Albumin 3.7   3.5 - 5.2 (g/dL)   AST 99 (*) 0 - 37 (U/L)   ALT 138 (*) 0 - 35 (U/L)   Alkaline Phosphatase 42  39 - 117 (U/L)   Total Bilirubin 1.4 (*) 0.3 - 1.2 (mg/dL)   GFR calc non Af Amer >90  >90 (mL/min)   GFR calc Af Amer >90  >90 (mL/min)  PREPARE RBC (CROSSMATCH)      Component Value Range   Order Confirmation ORDER PROCESSED BY BLOOD BANK    TYPE AND SCREEN      Component Value Range   ABO/RH(D) A POS     Antibody Screen NEG     Sample Expiration 04/20/2011     Unit Number [id number]     Blood Component Type RBC CPDA1, LR     Unit division 00     Status of Unit ISSUED     Transfusion Status OK TO TRANSFUSE     Crossmatch Result COMPATIBLE     Unit Number [id number]     Blood Component Type RBC CPDA1, LR     Unit division 00     Status of Unit DISCARDED     Transfusion Status OK TO TRANSFUSE     Crossmatch Result COMPATIBLE     Unit Number [id number]     Blood Component Type RED CELLS,LR     Unit division 00     Status of Unit ISSUED     Transfusion Status OK TO TRANSFUSE     Crossmatch Result COMPATIBLE     Unit Number [id number]     Blood Component Type RED CELLS,LR     Unit division 00     Status of Unit ISSUED     Transfusion Status OK TO TRANSFUSE  Crossmatch Result COMPATIBLE    HCG, QUANTITATIVE, PREGNANCY      Component Value Range   hCG, Beta Chain, Quant, S 2193 (*) <5 (mIU/mL)  ABO/RH      Component Value Range   ABO/RH(D) A POS    PREPARE RBC (CROSSMATCH)      Component Value Range   Order Confirmation ORDER PROCESSED BY BLOOD BANK     Laboratory interpretation all normal except anemia, hypokalemia, minor elevation of LFT's (dx with mono today).   Ct Abdomen Pelvis W Contrast  04/17/2011  *RADIOLOGY REPORT*  Clinical Data: Possible retroperitoneal bleeding.  Patient status post abortion 5 days ago.  CT ABDOMEN AND PELVIS WITH CONTRAST  Technique:  Multidetector CT imaging of the abdomen and pelvis was performed following the standard protocol during bolus  administration of intravenous contrast.  Contrast: OMNIPAQUE IOHEXOL 300 MG/ML IV SOLN  Comparison: None.  Findings: The lung bases are clear.  No pleural or pericardial effusion.  There is a large volume of hemorrhage throughout the abdomen.  The uterus appears ruptured, best visualized in the fundus.  Adnexa are not visible.  Foley catheter is in place.  The liver, spleen, adrenal glands, pancreas, gallbladder and kidneys appear normal.  There is no lymphadenopathy.  No focal bony abnormality.  IMPRESSION: Large volume of intra-abdominal and pelvic hemorrhage due to rupture of the uterus. Critical Value/emergent results were called by telephone at the time of interpretation on 04/17/2011  at 9:22 p.m.  to  Dr. Lynelle Doctor, who verbally acknowledged these results.  Original Report Authenticated By: Bernadene Bell. Maricela Curet, M.D.     Date: 04/17/2011  Rate: 106  Rhythm: sinus tachycardia  QRS Axis: normal  Intervals: normal  ST/T Wave abnormalities: nonspecific T wave changes  Conduction Disutrbances:normal  Narrative Interpretation:   Old EKG Reviewed: none available     Diagnoses that have been ruled out:  None  Diagnoses that are still under consideration:  None  Final diagnoses:  Abortion with perforation of uterus  Hemorrhage intraperitoneal  Anemia    Plan admission  Devoria Albe, MD, FACEP   CRITICAL CARE Performed by: Devoria Albe L   Total critical care time: 45 minutes Critical care time was exclusive of separately billable procedures and treating other patients.  Critical care was necessary to treat or prevent imminent or life-threatening deterioration.  Critical care was time spent personally by me on the following activities: development of treatment plan with patient and/or surrogate as well as nursing, discussions with consultants, evaluation of patient's response to treatment, examination of patient, obtaining history from patient or surrogate, ordering and performing  treatments and interventions, ordering and review of laboratory studies, ordering and review of radiographic studies, pulse oximetry and re-evaluation of patient's condition.    MDM     Addendum  11:35 AM 04/18/2011 Pt found to have ruptured ectopic pregnancy in OR     Ward Givens, MD 04/17/11 2204  Ward Givens, MD 04/18/11 1135

## 2011-04-17 NOTE — Progress Notes (Signed)
Subjective:   HPI  Courtney Wheeler is a 31 y.o. female who presents with multiple c/o.   She notes that she had an abortion on Saturday at a private clinic, but the day before had come down with a bad stomach bug.  Friday she had numerous episodes of diarrhea that night, nausea, vomiting, belly pain, and continued to have vomiting up to 5 times daily the next few days.  The diarrhea had finally stopped, and she did not have another BM until just now in our clinic had a bad round of diarrhea.  She notes that her parents found her passed out with dehydration in her bed over the weekend.  During the abortion procedure, she notes that she kept passing out and had to have numerous rounds of IV fluids.  She hasn't really felt good all week.  She current reports having no energy, head has a pounding sound in her ears, has nausea, decreased food intake and decreased appetite, is forcing herself to drink water, but is trying to drink at least 1500 ml of water daily the last few days.  She is urinating hardly anything at all.  She notes looking very pale compared to her normal self.  She has hx/o anemia. Of note, her sister was diagnosed with mono recently.  Her vision seems off today, somewhat tunnel vision like.  She notes in the past even when a young child she was quick to pass out.  She note hx/o heart murmurs as a child.  No other aggravating or relieving factors.    No other c/o.  The following portions of the patient's history were reviewed and updated as appropriate: allergies, current medications, past family history, past medical history, past social history, past surgical history and problem list.  Past Medical History  Diagnosis Date  . Allergy     RHINITIS    Review of Systems Constitutional: -fever, +chills, -sweats, +10 lb weight loss with recent pregnancy, +fatigue ENT: -runny nose, -ear pain, -sore throat Cardiology:  -chest pain, -palpitations, -edema Respiratory: -cough, -shortness  of breath, -wheezing Gastroenterology: +abdominal pain, +nausea Hematology: -bleeding or bruising problems Ophthalmology: +vision changes Urology: +oliguria; -dysuria, -hematuria, -urinary frequency, -urgency Neurology: -headache, -weakness, -tingling, -numbness   Objective:   Physical Exam  Filed Vitals:   04/17/11 1436  BP: 90/60  Pulse: 80  Temp: 98 F (36.7 C)  Resp: 16    General appearance: alert, anxious appearing, white female Skin: generalized pallor, skin also appears possibly mildly jaundiced HEENT: normocephalic, sclerae anicteric, conjunctival pallor, PERRLA, EOMi, nares patent, no discharge or erythema, pharynx normal Oral cavity: MMM, no lesions Neck: supple, no lymphadenopathy, no thyromegaly, no masses Heart: RRR, normal S1, S2, no murmurs Lungs: CTA bilaterally, no wheezes, rhonchi, or rales Abdomen: +bs, soft, mild generalized tenderness, non distended, no masses, no hepatomegaly, no splenomegaly Extremities: no edema, no cyanosis, no clubbing Pulses: 2+ symmetric, normal cap refill Neurological: alert, oriented x 3, CN2-12 intact    Adult ECG Report  Indication: syncope  Rate: 92 bpm  Rhythm: normal sinus rhythm  QRS Axis: 78  PR Interval: 146 ms  QRS Duration: 78ms  QTc:  Conduction Disturbances: none  Other Abnormalities: none  Patient's cardiac risk factors are: none.  EKG comparison: none  Narrative Interpretation: normal EKG    Assessment and Plan :    Encounter Diagnoses  Name Primary?  . Dehydration Yes  . Diarrhea   . Pallor   . Fatigue   . Syncope  In the office she appeared dehydrated and pale. Given her symptoms I suggested she go to the emergency department. After discussion with her and her father, they opted for me to do some stat labs and have her try to rehydrate some more at home. Of note her EKG today is normal, orthostatics did not show a major drop, however I am still concerned that she may not be able to  improve all that well with rehydration on her own.  She had a positive Monospot in the office today as well. We discussed usual course of illness of mono, precautions including no sports for 6 weeks minimal. It would appear that she's had a gastroenteritis a week ago, followed by the abortion, followed by persistent dehydration and fatigue at this point, now positive for mono.   So for now we will get stat labs, I discussed in detail for her to work on rehydration with both water and electrolytes, rest, and if worse go to the emergency department. She will be supervised by her father, and I will call in a few hours with stat labs.  Addendum: Lab just called me with a critical hemoglobin of 4.4, hematocrit 13.8%.  I called her father back and advised results and to go to the emergency department now. He is going to take her to [facility] long emergency department at this time.  I called emergency department triage and advised them as well.

## 2011-04-17 NOTE — Transfer of Care (Signed)
Immediate Anesthesia Transfer of Care Note  Patient: [name]  Procedure(s) Performed:  EXPLORATORY LAPAROTOMY; LAPAROSCOPIC UNILATERAL SALPINGECTOMY  Patient Location: PACU  Anesthesia Type: General  Level of Consciousness: awake, alert , oriented and patient cooperative  Airway & Oxygen Therapy: Patient Spontanous Breathing and Patient connected to face mask oxygen  Post-op Assessment: Report given to PACU RN and Post -op Vital signs reviewed and stable  Post vital signs: Reviewed and stable  Complications: No apparent anesthesia complications

## 2011-04-17 NOTE — ED Notes (Signed)
Pt reports she had a D&C on Saturday 04/12/11 - pt w/ hx of low hgb and poor tolerance to iron - since pt has had increased weakness and near syncope. Pt appears jaundice and pale in skin color. Pt reported to have a Hgb of 4.4 and positive (+) for mononucleosis.

## 2011-04-17 NOTE — Op Note (Signed)
04/17/2011  11:44 PM  PATIENT: Lynita Lombard PRE-OPERATIVE DIAGNOSIS:  ruptured uterus S/p elective termination POST-OPERATIVE DIAGNOSIS:  Ruptured right ectopic pregnancy  PROCEDURE:exploratory laparotomy, right salpingectomy  SURGEON:  Surgeon(s): Serita Kyle, MD Lenoard Aden, MD  ASSISTANTS: Olivia Mackie, MD ANESTHESIA:   general  FINDINGS: 600cc hemoperitoneum. Nl left tube and ovary, right ruptured ectopic, nl right ov, nl small uterus    ESTIMATED BLOOD LOSS: min Intake/Output Summary (Last 24 hours) at 04/17/11 2344 Last data filed at 04/17/11 2333  Gross per 24 hour  Intake   5200 ml  Output   1200 ml  Net   4000 ml     BLOOD ADMINISTERED:one unit intraop CC PRBC   SPECIMEN: right tube  DISPOSITION OF SPECIMEN:  PATHOLOGY  COUNTS:  YES  PLAN OF CARE: admit overnight

## 2011-04-17 NOTE — Anesthesia Postprocedure Evaluation (Signed)
Anesthesia Post Note  Patient: Courtney Wheeler  Procedure(s) Performed:  EXPLORATORY LAPAROTOMY; LAPAROSCOPIC UNILATERAL SALPINGECTOMY  Anesthesia type: General  Patient location: PACU  Post pain: Pain level controlled  Post assessment: Post-op Vital signs reviewed  Last Vitals:  Filed Vitals:   04/17/11 2335  BP:   Pulse:   Temp: 36.2 C  Resp:     Post vital signs: Reviewed  Level of consciousness: sedated  Complications: No apparent anesthesia complications

## 2011-04-17 NOTE — ED Notes (Signed)
Hemoglobin 5.1 Notified Dr. Lars Mage at 20:38 Dr. Lynelle Doctor responded: 20:38

## 2011-04-17 NOTE — ED Notes (Signed)
No chest xray needed per MD

## 2011-04-18 LAB — CBC
MCH: 29.6 pg (ref 26.0–34.0)
MCHC: 34.6 g/dL (ref 30.0–36.0)
MCV: 85.5 fL (ref 78.0–100.0)
Platelets: 201 10*3/uL (ref 150–400)
RDW: 20.3 % — ABNORMAL HIGH (ref 11.5–15.5)

## 2011-04-18 LAB — PREPARE RBC (CROSSMATCH)

## 2011-04-18 LAB — ABO/RH: ABO/RH(D): A POS

## 2011-04-18 MED ORDER — OXYCODONE-ACETAMINOPHEN 5-325 MG PO TABS
1.0000 | ORAL_TABLET | ORAL | Status: DC | PRN
  Administered 2011-04-18 – 2011-04-19 (×3): 2 via ORAL
  Filled 2011-04-18 (×3): qty 2

## 2011-04-18 MED ORDER — ZOLPIDEM TARTRATE 5 MG PO TABS: 5.0000 mg | ORAL_TABLET | Freq: Every evening | ORAL | Status: DC | PRN

## 2011-04-18 MED ORDER — IBUPROFEN 800 MG PO TABS: 800.0000 mg | ORAL_TABLET | Freq: Three times a day (TID) | ORAL | Status: DC | PRN

## 2011-04-18 MED ORDER — ONDANSETRON HCL 4 MG PO TABS: 4.0000 mg | ORAL_TABLET | Freq: Four times a day (QID) | ORAL | Status: DC | PRN

## 2011-04-18 MED ORDER — ONDANSETRON HCL 4 MG/2ML IJ SOLN: 4.0000 mg | Freq: Four times a day (QID) | INTRAMUSCULAR | Status: DC | PRN

## 2011-04-18 MED ORDER — DIPHENHYDRAMINE HCL 25 MG PO CAPS
25.0000 mg | ORAL_CAPSULE | Freq: Once | ORAL | Status: AC
  Administered 2011-04-18: 25 mg via ORAL
  Filled 2011-04-18: qty 1

## 2011-04-18 MED ORDER — MENTHOL 3 MG MT LOZG: 1.0000 | LOZENGE | OROMUCOSAL | Status: DC | PRN

## 2011-04-18 MED ORDER — NALOXONE HCL 0.4 MG/ML IJ SOLN: 0.4000 mg | INTRAMUSCULAR | Status: DC | PRN

## 2011-04-18 MED ORDER — DIPHENHYDRAMINE HCL 12.5 MG/5ML PO ELIX: 12.5000 mg | ORAL_SOLUTION | Freq: Four times a day (QID) | ORAL | Status: DC | PRN

## 2011-04-18 MED ORDER — ALPRAZOLAM 0.25 MG PO TABS
0.2500 mg | ORAL_TABLET | Freq: Three times a day (TID) | ORAL | Status: DC | PRN
  Administered 2011-04-19: 0.25 mg via ORAL
  Filled 2011-04-18: qty 1

## 2011-04-18 MED ORDER — PANTOPRAZOLE SODIUM 40 MG PO TBEC
40.0000 mg | DELAYED_RELEASE_TABLET | Freq: Every day | ORAL | Status: DC
  Administered 2011-04-18 – 2011-04-19 (×2): 40 mg via ORAL
  Filled 2011-04-18 (×2): qty 1

## 2011-04-18 MED ORDER — ACETAMINOPHEN 325 MG PO TABS
650.0000 mg | ORAL_TABLET | Freq: Once | ORAL | Status: AC
  Administered 2011-04-18: 650 mg via ORAL
  Filled 2011-04-18: qty 2

## 2011-04-18 MED ORDER — LIP MEDEX EX OINT
TOPICAL_OINTMENT | CUTANEOUS | Status: AC
  Filled 2011-04-18: qty 7

## 2011-04-18 MED ORDER — DIPHENHYDRAMINE HCL 50 MG/ML IJ SOLN: 12.5000 mg | Freq: Four times a day (QID) | INTRAMUSCULAR | Status: DC | PRN

## 2011-04-18 MED ORDER — SODIUM CHLORIDE 0.9 % IJ SOLN: 9.0000 mL | INTRAMUSCULAR | Status: DC | PRN

## 2011-04-18 MED ORDER — DEXTROSE IN LACTATED RINGERS 5 % IV SOLN
INTRAVENOUS | Status: DC
  Administered 2011-04-18 (×2): via INTRAVENOUS

## 2011-04-18 NOTE — Progress Notes (Signed)
1 Day Post-Op Procedure(s): UNILATERAL SALPINGECTOMY(right) EXPLORATORY LAPAROTOMY  Subjective: Patient reports tolerating PO and + flatus.  Have not voided as yet. Pt notes inability to get up due to dizziness/lightheadedness. S/p 4 units of PRBC  Objective: I have reviewed patient's vital signs, intake and output and labs.  General: alert, cooperative and no distress Cardio: tachycardia GI: incision: clean and dry, intact soft nondistended (+) hypoactive BS  Assessment: s/p Procedure(s): UNILATERAL SALPINGECTOMY EXPLORATORY LAPAROTOMY: stable  Plan: Advance diet Encourage ambulation Advance to PO medication will transfuse with one more unit of blood. D/c PCA .  LOS: 1 day    Courtney Wheeler A 04/18/2011, 12:51 PM

## 2011-04-18 NOTE — OR Nursing (Signed)
Pt tx to room 1527 via bed, sr x 2 up w/ PACU RN, VS remain stable, pain under control, report given to rec RN Ferdinand Cava), opportunity for questions provided

## 2011-04-18 NOTE — Progress Notes (Signed)
Clarification order obtained from Dr. Juliene Pina via phone regarding IV fluid status. Order received to discontinue IV fluids. Diet changed as well.

## 2011-04-18 NOTE — Op Note (Signed)
Courtney Wheeler, Courtney Wheeler             ACCOUNT NO.:  [id number]  MEDICAL RECORD NO.:  [id number]  LOCATION:  [facility]                         FACILITY:  [facility]  PHYSICIAN:  Maxie Better, M.D.DATE OF BIRTH:  [birth year]  DATE OF PROCEDURE:  04/17/2011 DATE OF DISCHARGE:                              OPERATIVE REPORT   PREOPERATIVE DIAGNOSIS:  Uterine rupture status post elective termination.  POSTOPERATIVE DIAGNOSES:  Ruptured right ectopic pregnancy.  PROCEDURE:  Exploratory laparotomy, right salpingectomy.  ANESTHESIA:  General.  SURGEON:  Maxie Better, M.D.  ASSISTANT:  Lenoard Aden, M.D.  PROCEDURE:  Under adequate general anesthesia, the patient was placed in the supine position.  The patient already had an indwelling Foley catheter.  She was sterilely prepped and draped in the usual fashion. 0.25% Marcaine was injected along the planned Pfannenstiel skin incision site.  A mini Pfannenstiel skin incision was then made and carried down to the rectus fascia.  The rectus fascia was opened transversely.  The rectus fascia was then bluntly and sharply dissected off the rectus muscle in a superior and inferior fashion.  The rectus muscle was split in midline.  The parietal peritoneum was entered sharply and extended. A large amount of intraperitoneal blood was noted, 600 mL was suctioned. The uterus was small.  There was a clearly ruptured ectopic with clots along the right fallopian tube, not salvageable.  The ovary on the right was normal.  The tube and ovary on the left was normal.  Right salpingectomy was performed with the mesosalpinx being clamped serially and suture ligated with 3-0 Vicryl.  The tube was removed.  The abdomen was copiously irrigated and suctioned of debris.  Clots were removed with good hemostasis noted.  The parietal peritoneum was closed with 2-0 Vicryl.  The rectus fascia was closed with 0 Vicryl x2.  The skin was approximated with Ethicon  staples.  SPECIMEN:  Right fallopian tube sent to Pathology.  ESTIMATED BLOOD LOSS:  For the procedure was minimal.  Intraoperative hemoperitoneum was 600 mL.  COMPLICATION:  None.  The patient tolerated the procedure well, was transferred to the recovery room in stable condition.     Maxie Better, M.D.     [place]/MEDQ  D:  04/17/2011  T:  04/18/2011  Job:  [id number]

## 2011-04-19 LAB — CBC
HCT: 34.4 % — ABNORMAL LOW (ref 36.0–46.0)
MCV: 87.1 fL (ref 78.0–100.0)
RBC: 3.95 MIL/uL (ref 3.87–5.11)
WBC: 15.9 10*3/uL — ABNORMAL HIGH (ref 4.0–10.5)

## 2011-04-19 MED ORDER — OXYCODONE-ACETAMINOPHEN 5-325 MG PO TABS: 1.0000 | ORAL_TABLET | ORAL | Status: AC | PRN

## 2011-04-19 NOTE — Progress Notes (Signed)
Assessment unchanged. Pt verbalized understanding of dc instructions. Script x1 given as provided by MD. Pt discharged via wc with mom, sister, and friends to front entrance to meet awaiting vehicle to carry home. Also accompanied by nurse tech.

## 2011-04-19 NOTE — Discharge Instructions (Signed)
Anemia, Nonspecific Your exam and blood tests show you are anemic. This means your blood (hemoglobin) level is low. Normal hemoglobin values are 12 to 15 g/dL for females and 14 to 17 g/dL for males. Make a note of your hemoglobin level today. The hematocrit percent is also used to measure anemia. A normal hematocrit is 38% to 46% in females and 42% to 49% in males. Make a note of your hematocrit level today. CAUSES  Anemia can be due to many different causes.  Excessive bleeding from periods (in women).   Intestinal bleeding.   Poor nutrition.   Kidney, thyroid, liver, and bone marrow diseases.  SYMPTOMS  Anemia can come on suddenly (acute). It can also come on slowly. Symptoms can include:  Minor weakness.   Dizziness.   Palpitations.   Shortness of breath.  Symptoms may be absent until half your hemoglobin is missing if it comes on slowly. Anemia due to acute blood loss from an injury or internal bleeding may require blood transfusion if the loss is severe. Hospital care is needed if you are anemic and there is significant continual blood loss. TREATMENT   Stool tests for blood (Hemoccult) and additional lab tests are often needed. This determines the best treatment.   Further checking on your condition and your response to treatment is very important. It often takes many weeks to correct anemia.  Depending on the cause, treatment can include:  Supplements of iron.   Vitamins B12 and folic acid.   Hormone medicines.If your anemia is due to bleeding, finding the cause of the blood loss is very important. This will help avoid further problems.  SEEK IMMEDIATE MEDICAL CARE IF:   You develop fainting, extreme weakness, shortness of breath, or chest pain.   You develop heavy vaginal bleeding.   You develop bloody or black, tarry stools or vomit up blood.   You develop a high fever, rash, repeated vomiting, or dehydration.  Document Released: 04/17/2004 Document Revised:  11/20/2010 Document Reviewed: 01/23/2009 [facility] Patient Information 2012 [place], [place].Laparotomy Care After Please read the instructions outlined below and refer to this sheet in the next few weeks. These discharge instructions provide you with general information on caring for yourself after you leave the hospital. Your doctor may also give you specific instructions. While your treatment has been planned according to the most current medical practices available, unavoidable complications occasionally occur. If you have any problems or questions after discharge, please call your doctor. HOME CARE INSTRUCTIONS ACTIVITY  Rest as much as possible the first two weeks at home.   Avoid strenuous activity such as heavy lifting (more than 10 pounds), pushing or pulling. Limit stair climbing to once or twice a day for the first week, then slowly increase this activity.   Take frequent rest periods throughout the day.   Talk with your doctor about when you may resume your usual physical activity.   Patients need to be out of bed and walking as much as possible. This decreases the chance of:   Blood clots.   Pneumonia.  NUTRITION  You can resume your normal diet once you regain bowel function.   Drink plenty of fluids (6-8 glasses a day or as instructed).   Eat a well-balanced diet.   Daily portions of food from the meat (protein), milk, vegetable, and bread groups are necessary for your health.  ELIMINATION It is very important not to strain during bowel movements. If constipation should occur, you may:  Take a mild  laxative (such as Milk of Magnesia)   Add fruit and bran to your diet   Drink more fluids  HYGIENE  You may wash your hair.   Take showers not baths until 4-6 weeks after surgery.   If your incision is closed, you may take a shower or tub bath.  FEVER If you feel feverish or have shaking chills, take your temperature. If it is 102 F (38.9 C), call your doctor.  The fever may mean there is an infection. PAIN CONTROL  Mild discomfort may occur.   Only take over-the-counter or prescription medicines for pain, discomfort, or fever as directed by your caregiver.   If a prescription was given, please follow your doctor's directions.   If pain is not relieved or becomes more severe, notify your doctor.  INCISION CARE  Keep your incision site clean with soap and water.   Do not use a dressing unless your cut (incision) from surgery is draining or irritated.   If you have small adhesive strips in place and they do not fall off within 10 days, carefully peel them off.   Check your incision and surrounding area daily for any redness, swelling, discoloration, heavy drainage or separation of the skin. If any of these are present, notify your doctor.  SEXUAL INTERCOURSE Do not have sexual intercourse until your follow-up appointment, unless your doctor tells you otherwise. SEEK MEDICAL CARE IF:   You are unable to tolerate food/drinks.   You are unable to pass gas or have a bowel movement.  Document Released: 10/23/2003 Document Revised: 11/20/2010 Document Reviewed: 03/09/2007 [facility] Patient Information 2012 [place], [place].

## 2011-04-19 NOTE — Progress Notes (Signed)
Called into pt's room because pt stated she was having a hard time breathing. Pt had 2 liters of O2 per nasal cannula. Pt was positioned low in the bed. Assist patient to reposition and had her take some deep breaths in through her nose and out through her mouth. O2 sat was 94% on 2 Liters, heart rate was 96. Patient's color was normal for ethnicity. Lungs were clear in lobes. Will continue to monitor.  Courtney Wheeler 5:14 AM 04/19/2011

## 2011-04-19 NOTE — Progress Notes (Signed)
Patient ID: Courtney Wheeler, female   DOB: May 23, 1980, 31 y.o.   MRN: [id number] Subjective: C/o incisional pain and difficulty with breathing. Just got out of bed today first time. Not doing IS well, but [place] doing it now. Lower O2 sats this AM, improved with sitting up and doing IS.  S/p 5 pRBCs(4 intra-op and 1 04/18/11). Denies dizziness, but feels weak. Tolerated general diet, ambulated a bit in the room. Voiding well. No BM, feels flatus and gas pain.   Objective: Vital signs in last 24 hours: Temp:  [98 F (36.7 C)-99.6 F (37.6 C)] 98.5 F (36.9 C) (01/26 0618) Pulse Rate:  [82-103] 95  (01/26 0618) Resp:  [15-20] 18  (01/26 0618) BP: (96-134)/(67-81) 112/78 mmHg (01/26 0618) SpO2:  [97 %] 97 % (01/26 0618) Weight change:  Last BM Date: 04/17/11  Intake/Output from previous day: 01/25 0701 - 01/26 0700 In: 2136.9 [P.O.:720; I.V.:1044; Blood:372.9] Out: 2500 [Urine:2500]  Physical exam:  A&O x 3, no acute distress. Pleasant HEENT neg Lungs CTA bilat, but decreased entry in bilateral bases and improved without crackles with deep breaths.  CV RRR, S1S2 normal (occ tachycardia) Abdo soft, non tender, non acute, active BS. Incision still covered with dressing, advised to remove in shower here before discharge home.  Extr no edema/ tenderness  Lab Results:  Basename 04/18/11 0340 04/17/11 2255 04/17/11 2010  WBC 11.9* -- 8.7  HGB 9.0* 7.8* --  HCT 26.0* 23.0* --  PLT 201 -- 261  this AM CBC pending.   Assessment/Plan: POD 2 s/p laparotomy and right salpingectomy for ruptured ectopic pregnancy with hemoperitoneum. S/p pRBCs for anemia from blood loss.   Doing well, stop O2 [place] and assess sats off O2. IS reviewed and encouraged, especially since smoker. Has not smoked for 1 wk, encouraged to quit (advised slight increased Ectopic risk in smokers). Surgical findings reviewed at the best of knowledge from brief op note. Advised to discuss details with Dr Billy Coast at follow up/    Post op care reviewed.  Ok to Discharge home if tolerated lunch, ambulated and keeping O2 sats above 95% on RA.  Pain mngmt reviewed, post op instructions, staple out in office and [facility] in office per Dr Billy Coast next wk.  Patient understands and agrees.  Quanna Wittke R 04/19/2011, 11:02 AM

## 2011-04-21 ENCOUNTER — Encounter (HOSPITAL_COMMUNITY): Payer: Self-pay | Admitting: Obstetrics and Gynecology

## 2011-04-21 LAB — TYPE AND SCREEN
ABO/RH(D): A POS
Antibody Screen: NEGATIVE
Unit division: 0
Unit division: 0
Unit division: 0
Unit division: 0
Unit division: 0

## 2011-04-23 NOTE — Discharge Summary (Addendum)
Physician Discharge Summary  Patient ID: Courtney Wheeler MRN: [id number] DOB/AGE: 11/11/1980 30 y.o.  Admit date: 04/17/2011 Discharge date: 04/19/2011  Admission Diagnoses: Suspected uterine perforation with hemoperitoneum                                         Severe anemia  Discharge Diagnoses: Ruptured right tubal ectopic pregnancy with hemoperitoneum                                        S/p laparotomy with right salpingectomy.   Discharged Condition: Improved, stable  Hospital Course: Patient was seen at [facility] for abnormally rising bHCG and was advised to follow up until definitive diagnosis was established. She had prior pregnancy termination history and desired to proceed sooner and went to [facility] clinic for care where D&E was performed for suspected failed intrauterine pregnancy. Patient then presented to PCP not feeling well and was noted to be severely anemic, sent to [facility] ED for CT, imaging noted possible uterine perforation and hemoperitoneum following recent D&E. Emergency laparotomy was performed by Dr Cherly Hensen (on call), assisted by Dr Billy Coast and patient was noted to have ruptured right tubal pregnancy, hemoperitoneum and no evidence of uterine perforation. She underwent right salpingectomy, received 4 units packed RBCs intra-op and one more on POD 1. On POD2 she was much improved, voiding, ambulating, tolerating general diet and vitals were stable. Incision was clean and dry with staples.   Discharge Exam: Blood pressure 112/78, pulse 95, temperature 98.5 F (36.9 C), temperature source Oral, resp. rate 18, height 5\' 3"  (1.6 m), weight 58.968 kg (130 lb), last menstrual period 02/16/2011, SpO2 97.00%. Exam wnl.   Disposition: Home or Self Care. Post op care, office f/up for staples and repeat quant HCG reviewed. Also discussed Smoking cessation (risk factor for ectopic) and risk of ectopic pregnancy in left tube. Patient understood.   Discharge  Orders    Future Appointments: Provider: Department: Dept Phone: Center:   04/28/2011 11:15 AM Carollee Herter, MD [facility] [phone] [facility]     Future Orders Please Complete By Expires   Diet - low sodium heart healthy      Increase activity slowly      Driving Restrictions      Comments:   2 wks   Lifting restrictions      Comments:   6 wks   Sexual Activity Restrictions      Comments:   4 wks   Discharge wound care:      Comments:   Keep clean and dry. Office visit for staple removal 1 wk post -op.   Call MD for:  temperature >100.4      Call MD for:  persistant nausea and vomiting      Call MD for:  persistant dizziness or light-headedness      Call MD for:  difficulty breathing, headache or visual disturbances      Call MD for:  redness, tenderness, or signs of infection (pain, swelling, redness, odor or green/yellow discharge around incision site)      Call MD for:  severe uncontrolled pain      Call MD for:  extreme fatigue        Medication List  As of 04/23/2011  8:02 AM  STOP taking these medications         ketorolac 10 MG tablet         TAKE these medications         ibuprofen 200 MG tablet   Commonly known as: ADVIL,MOTRIN   Take 200-600 mg by mouth every 6 (six) hours as needed. For pain      norethindrone-ethinyl estradiol 1-20 MG-MCG tablet   Commonly known as: JUNEL FE,GILDESS FE,LOESTRIN FE   Take 1 tablet by mouth daily.      oxyCODONE-acetaminophen 5-325 MG per tablet   Commonly known as: PERCOCET   Take 1-2 tablets by mouth every 3 (three) hours as needed (moderate to severe pain (when tolerating fluids)).      promethazine 25 MG tablet   Commonly known as: PHENERGAN   Take 25 mg by mouth every 6 (six) hours as needed. For nausea           Follow-up Information    Follow up with Lenoard Aden, MD in 1 week.   Contact information:   [address] [place] [address] [phone]           Signed: Robley Fries 04/23/2011, 8:02 AM

## 2011-04-28 ENCOUNTER — Encounter: Payer: BC Managed Care – PPO | Admitting: Family Medicine

## 2011-11-26 ENCOUNTER — Ambulatory Visit (INDEPENDENT_AMBULATORY_CARE_PROVIDER_SITE_OTHER): Payer: BC Managed Care – PPO | Admitting: Surgery

## 2011-12-09 ENCOUNTER — Ambulatory Visit (INDEPENDENT_AMBULATORY_CARE_PROVIDER_SITE_OTHER): Payer: BC Managed Care – PPO | Admitting: Surgery

## 2011-12-09 ENCOUNTER — Encounter (INDEPENDENT_AMBULATORY_CARE_PROVIDER_SITE_OTHER): Payer: Self-pay | Admitting: Surgery

## 2011-12-09 VITALS — BP 96/68 | HR 77 | Temp 98.4°F | Ht 64.0 in | Wt 122.4 lb

## 2011-12-09 DIAGNOSIS — L738 Other specified follicular disorders: Secondary | ICD-10-CM

## 2011-12-09 DIAGNOSIS — L739 Follicular disorder, unspecified: Secondary | ICD-10-CM

## 2011-12-09 NOTE — Progress Notes (Signed)
Patient ID: Courtney Wheeler, female   DOB: [birth year], 31 y.o.   MRN: [id number]  Chief Complaint  Patient presents with  . Pre-op Exam    eval hidra in groin area    HPI Courtney Wheeler is a 31 y.o. female.  Patient presents at the request of Dr.Taavon do 2 history of chronic abscess right groin. This has been going on since January of this year. She has developed a small abscess in her right groin which is been treated with drainage and antibiotics off and on for the last 7 months. She is sent for evaluation for hidradenitis.he has no current problem currently. HPI  Past Medical History  Diagnosis Date  . Allergy     RHINITIS  . Heart murmur   . Seizure disorder   . Anemia   . Blood transfusion     Past Surgical History  Procedure Date  . Dilation and curettage of uterus   . Laparotomy 04/17/2011    Procedure: EXPLORATORY LAPAROTOMY;  Surgeon: Courtney Kyle, MD;  Location: [facility];  Service: Gynecology;;    Family History  Problem Relation Age of Onset  . Mental illness Maternal Aunt   . Heart disease Maternal Uncle   . Diabetes Maternal Grandmother   . Hypertension Maternal Grandmother   . Arthritis Paternal Grandmother   . Asthma Paternal Grandmother   . Birth defects Paternal Grandmother   . Hypertension Paternal Grandmother   . Cancer Paternal Grandfather     bladder  . Heart disease Paternal Grandfather   . Hypertension Paternal Grandfather   . Stroke Paternal Grandfather   . Cancer Cousin     breast    Social History History  Substance Use Topics  . Smoking status: Former Smoker    Types: Cigarettes    Quit date: 04/25/2011  . Smokeless tobacco: Never Used  . Alcohol Use: Yes     4 drinks a week    Allergies  Allergen Reactions  . Penicillins     Current Outpatient Prescriptions  Medication Sig Dispense Refill  . norethindrone-ethinyl estradiol (JUNEL FE,GILDESS FE,LOESTRIN FE) 1-20 MG-MCG tablet Take 1 tablet by mouth daily.         Review of Systems Review of Systems  Constitutional: Negative for fever, chills and unexpected weight change.  HENT: Negative for hearing loss, congestion, sore throat, trouble swallowing and voice change.   Eyes: Negative for visual disturbance.  Respiratory: Negative for cough and wheezing.   Cardiovascular: Negative for chest pain, palpitations and leg swelling.  Gastrointestinal: Negative for nausea, vomiting, abdominal pain, diarrhea, constipation, blood in stool, abdominal distention and anal bleeding.  Genitourinary: Negative for hematuria, vaginal bleeding and difficulty urinating.  Musculoskeletal: Negative for arthralgias.  Skin: Negative for rash and wound.  Neurological: Negative for seizures, syncope and headaches.  Hematological: Negative for adenopathy. Does not bruise/bleed easily.  Psychiatric/Behavioral: Negative for confusion.    Blood pressure 96/68, pulse 77, temperature 98.4 F (36.9 C), temperature source Temporal, height 5\' 4"  (1.626 m), weight 122 lb 6.4 oz (55.52 kg), SpO2 98.00%.  Physical Exam Physical Exam  Constitutional: She is oriented to person, place, and time. She appears well-developed and well-nourished.  HENT:  Head: Normocephalic and atraumatic.  Eyes: EOM are normal. Pupils are equal, round, and reactive to light.  Neck: Normal range of motion. Neck supple.  Musculoskeletal: Normal range of motion.  Neurological: She is alert and oriented to person, place, and time.  Skin:  Assessment    History chronic abscess right groin. No signs of active infection or abscess currently. No clinical signs of hidradenitis by exam.    Plan    This represents a chronic infected hair follicle. There is no clinical signs of hidradenitis currently. Recommend observation. When it flares up, I have asked her to call at once. Recommend alternative hair removal process  for the area since shaving can exacerbate this.       Courtney Wheeler  A. 12/09/2011, 9:37 AM

## 2011-12-09 NOTE — Patient Instructions (Signed)
Return if condition recurs.  May need to look at alternative hair removal options.

## 2012-04-08 ENCOUNTER — Encounter: Payer: Self-pay | Admitting: Family Medicine

## 2012-04-08 ENCOUNTER — Ambulatory Visit (INDEPENDENT_AMBULATORY_CARE_PROVIDER_SITE_OTHER): Payer: BC Managed Care – PPO | Admitting: Family Medicine

## 2012-04-08 VITALS — BP 120/70 | HR 80 | Temp 98.6°F | Ht 64.0 in | Wt 121.0 lb

## 2012-04-08 DIAGNOSIS — R059 Cough, unspecified: Secondary | ICD-10-CM

## 2012-04-08 DIAGNOSIS — R6883 Chills (without fever): Secondary | ICD-10-CM

## 2012-04-08 DIAGNOSIS — R05 Cough: Secondary | ICD-10-CM

## 2012-04-08 LAB — POCT INFLUENZA A/B
Influenza A, POC: NEGATIVE
Influenza B, POC: NEGATIVE

## 2012-04-08 NOTE — Progress Notes (Signed)
Chief Complaint  Patient presents with  . Cough    non productive, dry cough and feels like someone is sitting on her chest. Chills and body aches, thinks she may have a fever-all started yesterday.   Yesterday started with headache, then developed soreness into her shoulders and upper back.  She developed a dry cough.  Slight runny nose (clear).  When she woke up this morning it felt like someone was sitting on her chest, felt short of breath.  No h/o RAD or use of inhalers.  +sick contacts. +chills today.  Denies sore throat.  R ear pain this morning, mild now.  Used OTC Robitussin (expectorant and decongestant)  Past Medical History  Diagnosis Date  . Allergy     RHINITIS  . Heart murmur   . Seizure disorder     5th grade  . Anemia   . Blood transfusion   . Ectopic pregnancy 03/2011    ruptured just prior to having abortion   Past Surgical History  Procedure Date  . Dilation and curettage of uterus 03/2011    abortion  . Laparotomy 04/17/2011    Procedure: EXPLORATORY LAPAROTOMY;  Surgeon: Serita Kyle, MD;  Location: [facility];  Service: Gynecology;;   History   Social History  . Marital Status: Single    Spouse Name: N/A    Number of Children: N/A  . Years of Education: N/A   Occupational History  . Not on file.   Social History Main Topics  . Smoking status: Former Smoker    Types: Cigarettes    Quit date: 04/25/2011  . Smokeless tobacco: Never Used  . Alcohol Use: Yes     Comment: 4 drinks a week  . Drug Use: No  . Sexually Active: Not on file   Other Topics Concern  . Not on file   Social History Narrative  . No narrative on file   Current Outpatient Prescriptions on File Prior to Visit  Medication Sig Dispense Refill  . Norethindrone-Ethinyl Estradiol-Fe Biphas (LO LOESTRIN FE) 1 MG-10 MCG / 10 MCG tablet Take 1 tablet by mouth daily.       Allergies  Allergen Reactions  . Penicillins Hives   ROS: Denies nausea, vomiting, diarrhea, abdominal  pain, skin rashes.  Denies chest pain.  +back pain, shortness of breath.  PHYSICAL EXAM: BP 120/70  Pulse 80  Temp 98.6 F (37 C) (Oral)  Ht 5\' 4"  (1.626 m)  Wt 121 lb (54.885 kg)  BMI 20.77 kg/m2 Pleasant female, well-appearing, in no distress, with occ dry cough HEENT:  PERRL, EOMI, conjunctiva clear. Nasal mucosa with mod edema, slightly red, no purulence, clear drainage.  Sinuses nontender. OP clear Lungs: clear bilaterally, no wheezes, rales or ronchi.  No wheezes or cough with forced expiration. Heart: regular rate and rhythm without murmur Skin: no rash  Influenza negative  ASSESSMENT/PLAN: 1. Cough  Influenza A/B  2. Chills  Influenza A/B   URI--supportive measures, OTC meds reviewed. Return if persistent/worsening symptoms.  Reviewed s/sx of sinusitis, and can call for ABX if develops in the next week or two.  Return for re-eval if worsening respiratory symptoms

## 2012-04-08 NOTE — Patient Instructions (Addendum)
I recommend Mucinex twice daily (as directed on package). Use Delsym syrup (dextromethorphan) twice daily if needed to help with cough. Use sudafed (phenylephrine or pseudoephedrine) as needed for nasal or sinus congestion.  Return if worsening fevers, shortness of breath. Get plenty of rest, drink plenty of fluids.  Wash your hands frequently.

## 2013-01-27 ENCOUNTER — Ambulatory Visit: Payer: Self-pay | Admitting: Podiatry

## 2013-06-16 ENCOUNTER — Encounter: Payer: Self-pay | Admitting: Family Medicine

## 2013-06-16 ENCOUNTER — Ambulatory Visit (INDEPENDENT_AMBULATORY_CARE_PROVIDER_SITE_OTHER): Payer: BC Managed Care – PPO | Admitting: Family Medicine

## 2013-06-16 VITALS — BP 110/70 | HR 68 | Temp 101.0°F | Ht 64.0 in | Wt 125.0 lb

## 2013-06-16 DIAGNOSIS — R509 Fever, unspecified: Secondary | ICD-10-CM

## 2013-06-16 DIAGNOSIS — J069 Acute upper respiratory infection, unspecified: Secondary | ICD-10-CM

## 2013-06-16 LAB — POC INFLUENZA A&B (BINAX/QUICKVUE)
INFLUENZA A, POC: NEGATIVE
Influenza B, POC: NEGATIVE

## 2013-06-16 MED ORDER — AZITHROMYCIN 250 MG PO TABS: ORAL_TABLET | ORAL | Status: DC

## 2013-06-16 NOTE — Patient Instructions (Signed)
  Start antibiotics if symptoms are progressively getting worse over the next 3-7 days, rather than improving.  Continue decongestants. You might want to add guaifenesin (ie Mucinex) if this ingredient isn't in the medication that you are taking--this is an expectorant to loosen the phlegm. Use tylenol, ibuprofen or aleve to help with fever and body pains.

## 2013-06-16 NOTE — Progress Notes (Signed)
Chief Complaint  Patient presents with  . Sinus Problem    sinus drainage, HA, sneezing and body aches since Friday.    2 nights ago she started with runny nose, sore throat and sneezing, as well as aches and pains.  She worked yesterday, and by the end of the day she had shaking chills.  She didn't check her temperature.  Currently has body aches and headaches.  She has been taking a severe cold and flu OTC medication, which has helped with the congestion.  +sore throat, +PND.  Denies any cough.  She was sneezing much worse yesterday than today.  +sick contacts at work  Past Medical History  Diagnosis Date  . Allergy     RHINITIS  . Heart murmur   . Seizure disorder     5th grade  . Anemia   . Blood transfusion   . Ectopic pregnancy 03/2011    ruptured just prior to having abortion   Past Surgical History  Procedure Laterality Date  . Dilation and curettage of uterus  03/2011    abortion  . Laparotomy  04/17/2011    Procedure: EXPLORATORY LAPAROTOMY;  Surgeon: Serita Kyle, MD;  Location: [facility];  Service: Gynecology;;   History   Social History  . Marital Status: Single    Spouse Name: N/A    Number of Children: N/A  . Years of Education: N/A   Occupational History  . Not on file.   Social History Main Topics  . Smoking status: Former Smoker    Types: Cigarettes    Quit date: 04/25/2011  . Smokeless tobacco: Never Used  . Alcohol Use: Yes     Comment: 4 drinks a week  . Drug Use: No  . Sexual Activity: Not on file   Other Topics Concern  . Not on file   Social History Narrative  . No narrative on file   No current outpatient prescriptions on file prior to visit.   No current facility-administered medications on file prior to visit.   Allergies  Allergen Reactions  . Penicillins Hives   ROS:  Denies nausea, vomiting, diarrhea, rashes, bleeding, bruising.  Slight shortness of breath.  No cough, no chest pain, no urinary complaints.  No vaginal  discharge, odor or itch.    Not in a sexual relationship, denies pregnancy  PHYSICAL EXAM: BP 110/70  Pulse 68  Temp(Src) 101 F (38.3 C)  Ht 5\' 4"  (1.626 m)  Wt 125 lb (56.7 kg)  BMI 21.45 kg/m2  LMP 06/06/2013 Well developed, pleasant female in no distress. No coughing, occasionally blowing her nose HEENT:  PERRL, EOMI, conjunctiva clear.  Nasal mucosa is moderate to severely edematous, no erythema.  White mucus.  Sinuses nontender.  OP is clear except for mild erythema posteriorly. Tonsils are normal Neck: no lymphadenopathy, thyromegaly or mass Heart: regular rate and rhythm without murmur Lungs: clear bilaterally Skin: no rashes Neuro: alert and oriented.  Cranial nerves intact, normal strength, gait Psych: normal mood, affect, hygiene and grooming  Influenza A&B negative  ASSESSMENT/PLAN:  Acute upper respiratory infections of unspecified site - Plan: azithromycin (ZITHROMAX) 250 MG tablet  Fever - Plan: POC Influenza A&B (Binax test)  Viral syndrome, URI.  Start antibiotics if symptoms are progressively getting worse over the next 3-7 days, rather than improving.  Continue decongestants. You might want to add guaifenesin (ie Mucinex) if this ingredient isn't in the medication that you are taking--this is an expectorant to loosen the phlegm. Use tylenol,  ibuprofen or aleve to help with fever and body pains.  F/u prn persistence/worsening of symptoms

## 2013-06-27 DIAGNOSIS — R7303 Prediabetes: Secondary | ICD-10-CM | POA: Insufficient documentation

## 2013-06-27 DIAGNOSIS — E559 Vitamin D deficiency, unspecified: Secondary | ICD-10-CM | POA: Insufficient documentation

## 2013-06-29 ENCOUNTER — Ambulatory Visit (INDEPENDENT_AMBULATORY_CARE_PROVIDER_SITE_OTHER): Payer: BC Managed Care – PPO | Admitting: Physician Assistant

## 2013-06-29 ENCOUNTER — Encounter: Payer: Self-pay | Admitting: Physician Assistant

## 2013-06-29 VITALS — BP 100/62 | HR 68 | Temp 98.2°F | Resp 16 | Ht 64.0 in | Wt 127.0 lb

## 2013-06-29 DIAGNOSIS — E559 Vitamin D deficiency, unspecified: Secondary | ICD-10-CM

## 2013-06-29 DIAGNOSIS — Z0000 Encounter for general adult medical examination without abnormal findings: Secondary | ICD-10-CM

## 2013-06-29 DIAGNOSIS — Z79899 Other long term (current) drug therapy: Secondary | ICD-10-CM

## 2013-06-29 DIAGNOSIS — D649 Anemia, unspecified: Secondary | ICD-10-CM

## 2013-06-29 LAB — LIPID PANEL
CHOL/HDL RATIO: 3.2 ratio
CHOLESTEROL: 165 mg/dL (ref 0–200)
HDL: 51 mg/dL (ref >39)
LDL Cholesterol: 97 mg/dL (ref 0–99)
TRIGLYCERIDES: 83 mg/dL (ref <150)
VLDL: 17 mg/dL (ref 0–40)

## 2013-06-29 LAB — CBC WITH DIFFERENTIAL/PLATELET
BASOS PCT: 1 % (ref 0–1)
Basophils Absolute: 0.1 10*3/uL (ref 0.0–0.1)
EOS ABS: 0.1 10*3/uL (ref 0.0–0.7)
EOS PCT: 1 % (ref 0–5)
HEMATOCRIT: 38.5 % (ref 36.0–46.0)
HEMOGLOBIN: 13.1 g/dL (ref 12.0–15.0)
LYMPHS ABS: 1.7 10*3/uL (ref 0.7–4.0)
Lymphocytes Relative: 31 % (ref 12–46)
MCH: 31.7 pg (ref 26.0–34.0)
MCHC: 34 g/dL (ref 30.0–36.0)
MCV: 93.2 fL (ref 78.0–100.0)
MONO ABS: 0.7 10*3/uL (ref 0.1–1.0)
MONOS PCT: 12 % (ref 3–12)
Neutro Abs: 3 10*3/uL (ref 1.7–7.7)
Neutrophils Relative %: 55 % (ref 43–77)
Platelets: 263 10*3/uL (ref 150–400)
RBC: 4.13 MIL/uL (ref 3.87–5.11)
RDW: 13.2 % (ref 11.5–15.5)
WBC: 5.5 10*3/uL (ref 4.0–10.5)

## 2013-06-29 LAB — TSH: TSH: 0.787 u[IU]/mL (ref 0.350–4.500)

## 2013-06-29 LAB — IRON AND TIBC
%SAT: 59 % — ABNORMAL HIGH (ref 20–55)
Iron: 205 ug/dL — ABNORMAL HIGH (ref 42–145)
TIBC: 347 ug/dL (ref 250–470)
UIBC: 142 ug/dL (ref 125–400)

## 2013-06-29 LAB — BASIC METABOLIC PANEL WITH GFR
BUN: 13 mg/dL (ref 6–23)
CALCIUM: 9.2 mg/dL (ref 8.4–10.5)
CO2: 24 meq/L (ref 19–32)
Chloride: 103 mEq/L (ref 96–112)
Creat: 0.86 mg/dL (ref 0.50–1.10)
GFR, Est Non African American: 88 mL/min
GLUCOSE: 77 mg/dL (ref 70–99)
Potassium: 4.6 mEq/L (ref 3.5–5.3)
SODIUM: 136 meq/L (ref 135–145)

## 2013-06-29 LAB — HEMOGLOBIN A1C
Hgb A1c MFr Bld: 5.3 % (ref <5.7)
Mean Plasma Glucose: 105 mg/dL (ref <117)

## 2013-06-29 LAB — HEPATIC FUNCTION PANEL
ALT: 14 U/L (ref 0–35)
AST: 21 U/L (ref 0–37)
Albumin: 4.2 g/dL (ref 3.5–5.2)
Alkaline Phosphatase: 33 U/L — ABNORMAL LOW (ref 39–117)
BILIRUBIN DIRECT: 0.1 mg/dL (ref 0.0–0.3)
Indirect Bilirubin: 0.5 mg/dL (ref 0.2–1.2)
TOTAL PROTEIN: 6.7 g/dL (ref 6.0–8.3)
Total Bilirubin: 0.6 mg/dL (ref 0.2–1.2)

## 2013-06-29 LAB — FERRITIN: FERRITIN: 21 ng/mL (ref 10–291)

## 2013-06-29 LAB — MAGNESIUM: Magnesium: 1.8 mg/dL (ref 1.5–2.5)

## 2013-06-29 LAB — VITAMIN B12: Vitamin B-12: 498 pg/mL (ref 211–911)

## 2013-06-29 NOTE — Patient Instructions (Signed)
Varicose Veins Varicose veins are veins that have become enlarged and twisted. CAUSES This condition is the result of valves in the veins not working properly. Valves in the veins help return blood from the leg to the heart. When your calf muscles squeeze, the blood moves up your leg then the valves close and this continues until the blood gets back to your heart.  If these valves are damaged, blood flows backwards and backs up into the veins in the leg near the skin OR if your are sitting/standing for a long time without using your calf muscles the blood will back up into the veins in your legs. This causes the veins to become larger. People who are on their feet a lot, sit a lot without walking (like on a plane, at a desk, or in a car), who are pregnant, or who are overweight are more likely to develop varicose veins. SYMPTOMS   Bulging, twisted-appearing, bluish veins, most commonly found on the legs.  Leg pain or a feeling of heaviness. These symptoms may be worse at the end of the day.  Leg swelling.  Skin color changes. DIAGNOSIS  Varicose veins can usually be diagnosed with an exam of your legs by your caregiver. He or she may recommend an ultrasound of your leg veins. TREATMENT  Most varicose veins can be treated at home.However, other treatments are available for people who have persistent symptoms or who want to treat the cosmetic appearance of the varicose veins. But this is only cosmetic and they will return if not properly treated. These include:  Laser treatment of very small varicose veins.  Medicine that is shot (injected) into the vein. This medicine hardens the walls of the vein and closes off the vein. This treatment is called sclerotherapy. Afterwards, you may need to wear clothing or bandages that apply pressure.  Surgery. HOME CARE INSTRUCTIONS   Do not stand or sit in one position for long periods of time. Do not sit with your legs crossed. Rest with your legs raised  during the day.  Your legs have to be higher than your heart so that gravity will force the valves to open, so please really elevate your legs.   Wear elastic stockings or support hose. Do not wear other tight, encircling garments around the legs, pelvis, or waist.  [employer]  has a wide variety of well priced compression stockings. [address], [place] [address] [phone]  OR THERE ARE COPPER INFUSED COMPRESSION SOCKS AT [facility] OR [facility]  Walk as much as possible to increase blood flow.  Raise the foot of your bed at night with 2-inch blocks.  If you get a cut in the skin over the vein and the vein bleeds, lie down with your leg raised and press on it with a clean cloth until the bleeding stops. Then place a bandage (dressing) on the cut. See your caregiver if it continues to bleed or needs stitches. SEEK MEDICAL CARE IF:   The skin around your ankle starts to break down.  You have pain, redness, tenderness, or hard swelling developing in your leg over a vein.  You are uncomfortable due to leg pain. Document Released: 12/18/2004 Document Revised: 06/02/2011 Document Reviewed: 05/06/2010 [employer] Patient Information 2014 [employer].   

## 2013-06-29 NOTE — Progress Notes (Signed)
Complete Physical HPI 33 y.o. female  presents for a complete physical. Her blood pressure has been controlled at home, today their BP is BP: 100/62 mmHg She does workout. She denies chest pain, shortness of breath, dizziness.  She is not on cholesterol medication and denies myalgias. Her cholesterol is at goal. The cholesterol last visit was: LDL 110  Last A1C in the office was: 5.2 Patient is on Vitamin D supplement. Vitamin D 43   Current Medications:  No current outpatient prescriptions on file prior to visit.   No current facility-administered medications on file prior to visit.   Health Maintenance:   Immunization History  Administered Date(s) Administered  . Tdap 06/28/2012   Tetanus: 2014 Pneumovax: N/A Flu vaccine: 2014 Zostavax: N/A Pap: 11/2012 normal MGM: N/A DEXA: N/A Colonoscopy: N/A EGD: N/A  Allergies:  Allergies  Allergen Reactions  . Penicillins Hives   Medical History:  Past Medical History  Diagnosis Date  . Allergy     RHINITIS  . Heart murmur   . Seizure disorder     5th grade  . Anemia   . Blood transfusion   . Ectopic pregnancy 03/2011    ruptured just prior to having abortion  . Vitamin D deficiency   . Prediabetes    Surgical History:  Past Surgical History  Procedure Laterality Date  . Dilation and curettage of uterus  03/2011    abortion  . Laparotomy  04/17/2011    Procedure: EXPLORATORY LAPAROTOMY;  Surgeon: Serita KyleSheronette A Cousins, MD;  Location: [facility];  Service: Gynecology;;   Family History:  Family History  Problem Relation Age of Onset  . Mental illness Maternal Aunt   . Heart disease Maternal Uncle   . Diabetes Maternal Grandmother   . Hypertension Maternal Grandmother   . Arthritis Paternal Grandmother   . Asthma Paternal Grandmother   . Birth defects Paternal Grandmother   . Hypertension Paternal Grandmother   . Cancer Paternal Grandfather     bladder  . Heart disease Paternal Grandfather   . Hypertension Paternal  Grandfather   . Stroke Paternal Grandfather   . Cancer Cousin     breast   Social History:  History  Substance Use Topics  . Smoking status: Former Smoker    Types: Cigarettes    Quit date: 04/25/2011  . Smokeless tobacco: Never Used  . Alcohol Use: Yes     Comment: 4 drinks a week     Review of Systems: [X]  = complains of  [ ]  = denies  General: Fatigue [ ]  Fever [ ]  Chills [ ]  Weakness [ ]   Insomnia [ ] Weight change [ ]  Night sweats [ ]   Change in appetite [ ]  Eyes: Redness [ ]  Blurred vision [ ]  Diplopia [ ]  Discharge [ ]   ENT: Congestion [ ]  Sinus Pain [ ]  Post Nasal Drip [ ]  Sore Throat [ ]  Earache [ ]  hearing loss [ ]  Tinnitus [ ]  Snoring [ ]   Cardiac: Chest pain/pressure [ ]  SOB [ ]  Orthopnea [ ]   Palpitations [ ]   Paroxysmal nocturnal dyspnea[ ]  Claudication [ ]  Edema [ ]   Pulmonary: Cough [ ]  Wheezing[ ]   SOB [ ]   Pleurisy [ ]   GI: Nausea [ ]  Vomiting[ ]  Dysphagia[ ]  Heartburn[ ]  Abdominal pain [ ]  Constipation [ ] ; Diarrhea [ ]  BRBPR [ ]  Melena[ ]  Bloating [ ]  Hemorrhoids [ ]   GU: Hematuria[ ]  Dysuria [ ]  Nocturia[ ]  Urgency [ ]   Hesitancy [ ]  Discharge [ ]   Frequency [ ]   Breast:  Breast lumps [ ]   nipple discharge [ ]    Neuro: Headaches[ ]  Vertigo[ ]  Paresthesias[ ]  Spasm [ ]  Speech changes [ ]  Incoordination [ ]   Ortho: Arthritis [ ]  Joint pain [ ]  Muscle pain [ ]  Joint swelling [ ]  Back Pain [ ]  Skin:  Rash [ ]   Pruritis [ ]  Change in skin lesion [ ]   Psych: Depression[ ]  Anxiety[ ]  Confusion [ ]  Memory loss [ ]   Heme/Lypmh: Bleeding [ ]  Bruising [ ]  Enlarged lymph nodes [ ]   Endocrine: Visual blurring [ ]  Paresthesia [ ]  Polyuria [ ]  Polydypsea [ ]    Heat/cold intolerance [ ]  Hypoglycemia [ ]   Physical Exam: Estimated body mass index is 21.79 kg/(m^2) as calculated from the following:   Height as of this encounter: 5\' 4"  (1.626 m).   Weight as of this encounter: 127 lb (57.607 kg). BP 100/62  Pulse 68  Temp(Src) 98.2 F (36.8 C)  Resp 16  Ht 5\' 4"  (1.626 m)   Wt 127 lb (57.607 kg)  BMI 21.79 kg/m2  LMP 06/06/2013 General Appearance: Well nourished, in no apparent distress. Eyes: PERRLA, EOMs, conjunctiva no swelling or erythema, normal fundi and vessels. Sinuses: No Frontal/maxillary tenderness ENT/Mouth: Ext aud canals clear, normal light reflex with TMs without erythema, bulging.  Good dentition. No erythema, swelling, or exudate on post pharynx. Tonsils not swollen or erythematous. Hearing normal.  Neck: Supple, thyroid normal. No bruits Respiratory: Respiratory effort normal, BS equal bilaterally without rales, rhonchi, wheezing or stridor. Cardio: RRR without murmurs, rubs or gallops. Brisk peripheral pulses without edema.  Chest: symmetric, with normal excursions and percussion. Breasts: defer Abdomen: Soft, +BS. Non tender, no guarding, rebound, hernias, masses, or organomegaly. .  Lymphatics: Non tender without lymphadenopathy.  Genitourinary: defer Musculoskeletal: Full ROM all peripheral extremities,5/5 strength, and normal gait. Skin: Warm, dry without rashes, lesions, ecchymosis.  Neuro: Cranial nerves intact, reflexes equal bilaterally. Normal muscle tone, no cerebellar symptoms. Sensation intact.  Psych: Awake and oriented X 3, normal affect, Insight and Judgment appropriate.   EKG: WNL no changes.  Assessment and Plan: Allergic rhinitis- Allegra OTC, increase H20, allergy hygiene explained.  Heart murmur- stable  Anemia- check levels  Ectopic pregnancy- on BCP  Vitamin D deficiency- Vitamin D  Prediabetes- check A1C    Discussed med's effects and SE's. Screening labs and tests as requested with regular follow-up as recommended.   Quentin Mulling 9:35 AM

## 2013-06-30 LAB — URINALYSIS, ROUTINE W REFLEX MICROSCOPIC
Bilirubin Urine: NEGATIVE
Glucose, UA: NEGATIVE mg/dL
Hgb urine dipstick: NEGATIVE
KETONES UR: NEGATIVE mg/dL
LEUKOCYTES UA: NEGATIVE
NITRITE: NEGATIVE
PH: 6.5 (ref 5.0–8.0)
Protein, ur: NEGATIVE mg/dL
SPECIFIC GRAVITY, URINE: 1.008 (ref 1.005–1.030)
Urobilinogen, UA: 0.2 mg/dL (ref 0.0–1.0)

## 2013-06-30 LAB — INSULIN, FASTING: Insulin fasting, serum: 10 u[IU]/mL (ref 3–28)

## 2013-06-30 LAB — MICROALBUMIN / CREATININE URINE RATIO
CREATININE, URINE: 37.1 mg/dL
MICROALB UR: 0.5 mg/dL (ref 0.00–1.89)
MICROALB/CREAT RATIO: 13.5 mg/g (ref 0.0–30.0)

## 2013-06-30 LAB — VITAMIN D 25 HYDROXY (VIT D DEFICIENCY, FRACTURES): VIT D 25 HYDROXY: 34 ng/mL (ref 30–89)

## 2013-07-27 ENCOUNTER — Ambulatory Visit: Payer: BC Managed Care – PPO | Admitting: Licensed Clinical Social Worker

## 2014-07-03 ENCOUNTER — Encounter: Payer: Self-pay | Admitting: Internal Medicine

## 2014-07-03 ENCOUNTER — Encounter: Payer: Self-pay | Admitting: Physician Assistant

## 2014-07-18 ENCOUNTER — Ambulatory Visit (INDEPENDENT_AMBULATORY_CARE_PROVIDER_SITE_OTHER): Payer: Commercial Indemnity | Admitting: Physician Assistant

## 2014-07-18 ENCOUNTER — Encounter: Payer: Self-pay | Admitting: Physician Assistant

## 2014-07-18 VITALS — BP 100/60 | HR 68 | Temp 97.9°F | Resp 16 | Ht 64.0 in | Wt 132.0 lb

## 2014-07-18 DIAGNOSIS — R7303 Prediabetes: Secondary | ICD-10-CM

## 2014-07-18 DIAGNOSIS — Z79899 Other long term (current) drug therapy: Secondary | ICD-10-CM

## 2014-07-18 DIAGNOSIS — E559 Vitamin D deficiency, unspecified: Secondary | ICD-10-CM

## 2014-07-18 DIAGNOSIS — R5383 Other fatigue: Secondary | ICD-10-CM

## 2014-07-18 DIAGNOSIS — Z0000 Encounter for general adult medical examination without abnormal findings: Secondary | ICD-10-CM

## 2014-07-18 DIAGNOSIS — D649 Anemia, unspecified: Secondary | ICD-10-CM

## 2014-07-18 LAB — CBC WITH DIFFERENTIAL/PLATELET
BASOS PCT: 0 % (ref 0–1)
Basophils Absolute: 0 10*3/uL (ref 0.0–0.1)
EOS ABS: 0.1 10*3/uL (ref 0.0–0.7)
EOS PCT: 1 % (ref 0–5)
HCT: 35.2 % — ABNORMAL LOW (ref 36.0–46.0)
HEMOGLOBIN: 11.8 g/dL — AB (ref 12.0–15.0)
LYMPHS PCT: 27 % (ref 12–46)
Lymphs Abs: 2.3 10*3/uL (ref 0.7–4.0)
MCH: 31.8 pg (ref 26.0–34.0)
MCHC: 33.5 g/dL (ref 30.0–36.0)
MCV: 94.9 fL (ref 78.0–100.0)
MONO ABS: 0.8 10*3/uL (ref 0.1–1.0)
MPV: 9.3 fL (ref 8.6–12.4)
Monocytes Relative: 9 % (ref 3–12)
NEUTROS PCT: 63 % (ref 43–77)
Neutro Abs: 5.3 10*3/uL (ref 1.7–7.7)
PLATELETS: 258 10*3/uL (ref 150–400)
RBC: 3.71 MIL/uL — AB (ref 3.87–5.11)
RDW: 13 % (ref 11.5–15.5)
WBC: 8.4 10*3/uL (ref 4.0–10.5)

## 2014-07-18 NOTE — Progress Notes (Signed)
Complete Physical  Assessment and Plan: 1. Prediabetes - CBC with Differential/Platelet - BASIC METABOLIC PANEL WITH GFR - Hepatic function panel - Lipid panel - Insulin, fasting  2. Vitamin D deficiency - Vit D  25 hydroxy (rtn osteoporosis monitoring)  3. Anemia, unspecified anemia type - Iron and TIBC - Ferritin  4. Other fatigue - TSH  5. Medication management - Magnesium  Discussed med's effects and SE's. Screening labs and tests as requested with regular follow-up as recommended. Over 40 minutes of exam, counseling, chart review, and critical decision making was performed this visit.   HPI  34 y.o. female  presents for a complete physical.  Her blood pressure has been controlled at home, today their BP is BP: 100/60 mmHg She does workout. She denies chest pain, shortness of breath, dizziness.  She is not on cholesterol medication and denies myalgias. Her cholesterol is at goal. The cholesterol last visit was:   Lab Results  Component Value Date   CHOL 165 06/29/2013   HDL 51 06/29/2013   LDLCALC 97 06/29/2013   TRIG 83 06/29/2013   CHOLHDL 3.2 06/29/2013  She has had elevated insulin in the past. Most recent A1C in the office was:  Lab Results  Component Value Date   HGBA1C 5.3 06/29/2013  Patient is on Vitamin D supplement.   Lab Results  Component Value Date   VD25OH 34 06/29/2013  She has a history of ectopic pregnancy 2013 s/p fallopion tube removal, follows with Dr. Rosemary Holmsavon. She states LMP, was in March, had chemical abortion on 22nd and she continues to bleed, she has feels very tired, is having cramping, having some weight gain 3 days, feels clothes are tight.  Wt Readings from Last 3 Encounters:  07/18/14 132 lb (59.875 kg)  06/29/13 127 lb (57.607 kg)  06/16/13 125 lb (56.7 kg)      Works in [employer], [employer], new job x a few months with [employer].   Current Medications:  No current outpatient prescriptions on file prior to visit.   No current  facility-administered medications on file prior to visit.   Health Maintenance:   Immunization History  Administered Date(s) Administered  . Tdap 06/28/2012   Tetanus: 2014 Pneumovax: N/A Flu vaccine: N/A Zostavax: N/A LMP: March, recent abortion Pap: 07/14/2014 MGM:  Feb 2016 normal, dense breast   DEXA: N/A Colonoscopy: N/A EGD: N/A CT AB 2013 Negative Hep panel 06/2011 Last Dental Exam: Last Eye Exam:  Patient Care Team: Lucky CowboyWilliam McKeown, MD as PCP - General (Internal Medicine)  Allergies:  Allergies  Allergen Reactions  . Penicillins Hives   Medical History:  Past Medical History  Diagnosis Date  . Allergy     RHINITIS  . Heart murmur   . Seizure disorder     5th grade  . Anemia   . Blood transfusion   . Ectopic pregnancy 03/2011    ruptured just prior to having abortion  . Vitamin D deficiency   . Prediabetes    Surgical History:  Past Surgical History  Procedure Laterality Date  . Dilation and curettage of uterus  03/2011    abortion  . Laparotomy  04/17/2011    Procedure: EXPLORATORY LAPAROTOMY;  Surgeon: Serita KyleSheronette A Cousins, MD;  Location: [facility];  Service: Gynecology;;   Family History:  Family History  Problem Relation Age of Onset  . Mental illness Maternal Aunt   . Heart disease Maternal Uncle   . Diabetes Maternal Grandmother   . Hypertension Maternal Grandmother   .  Arthritis Paternal Grandmother   . Asthma Paternal Grandmother   . Birth defects Paternal Grandmother   . Hypertension Paternal Grandmother   . Cancer Paternal Grandfather     bladder  . Heart disease Paternal Grandfather   . Hypertension Paternal Grandfather   . Stroke Paternal Grandfather   . Cancer Cousin     breast   Social History:  History  Substance Use Topics  . Smoking status: Former Smoker    Types: Cigarettes    Quit date: 04/25/2011  . Smokeless tobacco: Never Used  . Alcohol Use: Yes     Comment: 4 drinks a week   Review of Systems: Review of  Systems  Constitutional: Positive for malaise/fatigue. Negative for fever, chills, weight loss and diaphoresis.  HENT: Negative.   Eyes: Negative.   Respiratory: Negative.   Cardiovascular: Negative.   Gastrointestinal: Positive for abdominal pain. Negative for heartburn, nausea, vomiting, diarrhea, constipation, blood in stool and melena.  Genitourinary: Negative.   Musculoskeletal: Negative.   Skin: Negative.   Neurological: Negative.  Negative for weakness.  Endo/Heme/Allergies: Negative.   Psychiatric/Behavioral: Negative.     Physical Exam: Estimated body mass index is 22.65 kg/(m^2) as calculated from the following:   Height as of this encounter:  (1.626 m).   Weight as of this encounter: 132 lb (59.875 kg). BP 100/60 mmHg  Pulse 68  Temp(Src) 97.9 F (36.6 C)  Resp 16  Ht  (1.626 m)  Wt 132 lb (59.875 kg)  BMI 22.65 kg/m2  LMP 06/04/2014 (Exact Date) General Appearance: Well nourished, in no apparent distress.  Eyes: PERRLA, EOMs, conjunctiva no swelling or erythema, normal fundi and vessels.  Sinuses: No Frontal/maxillary tenderness  ENT/Mouth: Ext aud canals clear, normal light reflex with TMs without erythema, bulging. Good dentition. No erythema, swelling, or exudate on post pharynx. Tonsils not swollen or erythematous. Hearing normal.  Neck: Supple, thyroid normal. No bruits  Respiratory: Respiratory effort normal, BS equal bilaterally without rales, rhonchi, wheezing or stridor.  Cardio: RRR without murmurs, rubs or gallops. Brisk peripheral pulses without edema.  Chest: symmetric, with normal excursions and percussion.  Breasts: Symmetric, without lumps, nipple discharge, retractions.  Abdomen: Soft, slight lower AB tenderness, no guarding, rebound, hernias, masses, or organomegaly.  Lymphatics: Non tender without lymphadenopathy.  Genitourinary: defer Musculoskeletal: Full ROM all peripheral extremities,5/5 strength, and normal gait.  Skin: Warm, dry  without rashes, lesions, ecchymosis. Neuro: Cranial nerves intact, reflexes equal bilaterally. Normal muscle tone, no cerebellar symptoms. Sensation intact.  Psych: Awake and oriented X 3, normal affect, Insight and Judgment appropriate.   EKG: WNL no changes. AORTA SCAN: WNL   Quentin Mulling 3:18 PM [facility]

## 2014-07-18 NOTE — Patient Instructions (Signed)
Preventive Care for Adults A healthy lifestyle and preventive care can promote health and wellness. Preventive health guidelines for women include the following key practices.  A routine yearly physical is a good way to check with your health care provider about your health and preventive screening. It is a chance to share any concerns and updates on your health and to receive a thorough exam.  Visit your dentist for a routine exam and preventive care every 6 months. Brush your teeth twice a day and floss once a day. Good oral hygiene prevents tooth decay and gum disease.  The frequency of eye exams is based on your age, health, family medical history, use of contact lenses, and other factors. Follow your health care provider's recommendations for frequency of eye exams.  Eat a healthy diet. Foods like vegetables, fruits, whole grains, low-fat dairy products, and lean protein foods contain the nutrients you need without too many calories. Decrease your intake of foods high in solid fats, added sugars, and salt. Eat the right amount of calories for you.Get information about a proper diet from your health care provider, if necessary.  Regular physical exercise is one of the most important things you can do for your health. Most adults should get at least 150 minutes of moderate-intensity exercise (any activity that increases your heart rate and causes you to sweat) each week. In addition, most adults need muscle-strengthening exercises on 2 or more days a week.  Maintain a healthy weight. The body mass index (BMI) is a screening tool to identify possible weight problems. It provides an estimate of body fat based on height and weight. Your health care provider can find your BMI and can help you achieve or maintain a healthy weight.For adults 20 years and older:  A BMI below 18.5 is considered underweight.  A BMI of 18.5 to 24.9 is normal.  A BMI of 25 to 29.9 is considered overweight.  A BMI of  30 and above is considered obese.  Maintain normal blood lipids and cholesterol levels by exercising and minimizing your intake of saturated fat. Eat a balanced diet with plenty of fruit and vegetables. Blood tests for lipids and cholesterol should begin at age 76 and be repeated every 5 years. If your lipid or cholesterol levels are high, you are over 50, or you are at high risk for heart disease, you may need your cholesterol levels checked more frequently.Ongoing high lipid and cholesterol levels should be treated with medicines if diet and exercise are not working.  If you smoke, find out from your health care provider how to quit. If you do not use tobacco, do not start.  Lung cancer screening is recommended for adults aged 22-80 years who are at high risk for developing lung cancer because of a history of smoking. A yearly low-dose CT scan of the lungs is recommended for people who have at least a 30-pack-year history of smoking and are a current smoker or have quit within the past 15 years. A pack year of smoking is smoking an average of 1 pack of cigarettes a day for 1 year (for example: 1 pack a day for 30 years or 2 packs a day for 15 years). Yearly screening should continue until the smoker has stopped smoking for at least 15 years. Yearly screening should be stopped for people who develop a health problem that would prevent them from having lung cancer treatment.  If you are pregnant, do not drink alcohol. If you are breastfeeding,  be very cautious about drinking alcohol. If you are not pregnant and choose to drink alcohol, do not have more than 1 drink per day. One drink is considered to be 12 ounces (355 mL) of beer, 5 ounces (148 mL) of wine, or 1.5 ounces (44 mL) of liquor.  Avoid use of street drugs. Do not share needles with anyone. Ask for help if you need support or instructions about stopping the use of drugs.  High blood pressure causes heart disease and increases the risk of  stroke. Your blood pressure should be checked at least every 1 to 2 years. Ongoing high blood pressure should be treated with medicines if weight loss and exercise do not work.  If you are 75-52 years old, ask your health care provider if you should take aspirin to prevent strokes.  Diabetes screening involves taking a blood sample to check your fasting blood sugar level. This should be done once every 3 years, after age 15, if you are within normal weight and without risk factors for diabetes. Testing should be considered at a younger age or be carried out more frequently if you are overweight and have at least 1 risk factor for diabetes.  Breast cancer screening is essential preventive care for women. You should practice "breast self-awareness." This means understanding the normal appearance and feel of your breasts and may include breast self-examination. Any changes detected, no matter how small, should be reported to a health care provider. Women in their 58s and 30s should have a clinical breast exam (CBE) by a health care provider as part of a regular health exam every 1 to 3 years. After age 16, women should have a CBE every year. Starting at age 53, women should consider having a mammogram (breast X-ray test) every year. Women who have a family history of breast cancer should talk to their health care provider about genetic screening. Women at a high risk of breast cancer should talk to their health care providers about having an MRI and a mammogram every year.  Breast cancer gene (BRCA)-related cancer risk assessment is recommended for women who have family members with BRCA-related cancers. BRCA-related cancers include breast, ovarian, tubal, and peritoneal cancers. Having family members with these cancers may be associated with an increased risk for harmful changes (mutations) in the breast cancer genes BRCA1 and BRCA2. Results of the assessment will determine the need for genetic counseling and  BRCA1 and BRCA2 testing.  Routine pelvic exams to screen for cancer are no longer recommended for nonpregnant women who are considered low risk for cancer of the pelvic organs (ovaries, uterus, and vagina) and who do not have symptoms. Ask your health care provider if a screening pelvic exam is right for you.  If you have had past treatment for cervical cancer or a condition that could lead to cancer, you need Pap tests and screening for cancer for at least 20 years after your treatment. If Pap tests have been discontinued, your risk factors (such as having a new sexual partner) need to be reassessed to determine if screening should be resumed. Some women have medical problems that increase the chance of getting cervical cancer. In these cases, your health care provider may recommend more frequent screening and Pap tests.  The HPV test is an additional test that may be used for cervical cancer screening. The HPV test looks for the virus that can cause the cell changes on the cervix. The cells collected during the Pap test can be  tested for HPV. The HPV test could be used to screen women aged 30 years and older, and should be used in women of any age who have unclear Pap test results. After the age of 30, women should have HPV testing at the same frequency as a Pap test.  Colorectal cancer can be detected and often prevented. Most routine colorectal cancer screening begins at the age of 50 years and continues through age 75 years. However, your health care provider may recommend screening at an earlier age if you have risk factors for colon cancer. On a yearly basis, your health care provider may provide home test kits to check for hidden blood in the stool. Use of a small camera at the end of a tube, to directly examine the colon (sigmoidoscopy or colonoscopy), can detect the earliest forms of colorectal cancer. Talk to your health care provider about this at age 50, when routine screening begins. Direct  exam of the colon should be repeated every 5-10 years through age 75 years, unless early forms of pre-cancerous polyps or small growths are found.  People who are at an increased risk for hepatitis B should be screened for this virus. You are considered at high risk for hepatitis B if:  You were born in a country where hepatitis B occurs often. Talk with your health care provider about which countries are considered high risk.  Your parents were born in a high-risk country and you have not received a shot to protect against hepatitis B (hepatitis B vaccine).  You have HIV or AIDS.  You use needles to inject street drugs.  You live with, or have sex with, someone who has hepatitis B.  You get hemodialysis treatment.  You take certain medicines for conditions like cancer, organ transplantation, and autoimmune conditions.  Hepatitis C blood testing is recommended for all people born from 1945 through 1965 and any individual with known risks for hepatitis C.  Practice safe sex. Use condoms and avoid high-risk sexual practices to reduce the spread of sexually transmitted infections (STIs). STIs include gonorrhea, chlamydia, syphilis, trichomonas, herpes, HPV, and human immunodeficiency virus (HIV). Herpes, HIV, and HPV are viral illnesses that have no cure. They can result in disability, cancer, and death.  You should be screened for sexually transmitted illnesses (STIs) including gonorrhea and chlamydia if:  You are sexually active and are younger than 24 years.  You are older than 24 years and your health care provider tells you that you are at risk for this type of infection.  Your sexual activity has changed since you were last screened and you are at an increased risk for chlamydia or gonorrhea. Ask your health care provider if you are at risk.  If you are at risk of being infected with HIV, it is recommended that you take a prescription medicine daily to prevent HIV infection. This is  called preexposure prophylaxis (PrEP). You are considered at risk if:  You are a heterosexual woman, are sexually active, and are at increased risk for HIV infection.  You take drugs by injection.  You are sexually active with a partner who has HIV.  Talk with your health care provider about whether you are at high risk of being infected with HIV. If you choose to begin PrEP, you should first be tested for HIV. You should then be tested every 3 months for as long as you are taking PrEP.  Osteoporosis is a disease in which the bones lose minerals and strength   with aging. This can result in serious bone fractures or breaks. The risk of osteoporosis can be identified using a bone density scan. Women ages 65 years and over and women at risk for fractures or osteoporosis should discuss screening with their health care providers. Ask your health care provider whether you should take a calcium supplement or vitamin D to reduce the rate of osteoporosis.  Menopause can be associated with physical symptoms and risks. Hormone replacement therapy is available to decrease symptoms and risks. You should talk to your health care provider about whether hormone replacement therapy is right for you.  Use sunscreen. Apply sunscreen liberally and repeatedly throughout the day. You should seek shade when your shadow is shorter than you. Protect yourself by wearing long sleeves, pants, a wide-brimmed hat, and sunglasses year round, whenever you are outdoors.  Once a month, do a whole body skin exam, using a mirror to look at the skin on your back. Tell your health care provider of new moles, moles that have irregular borders, moles that are larger than a pencil eraser, or moles that have changed in shape or color.  Stay current with required vaccines (immunizations).  Influenza vaccine. All adults should be immunized every year.  Tetanus, diphtheria, and acellular pertussis (Td, Tdap) vaccine. Pregnant women should  receive 1 dose of Tdap vaccine during each pregnancy. The dose should be obtained regardless of the length of time since the last dose. Immunization is preferred during the 27th-36th week of gestation. An adult who has not previously received Tdap or who does not know her vaccine status should receive 1 dose of Tdap. This initial dose should be followed by tetanus and diphtheria toxoids (Td) booster doses every 10 years. Adults with an unknown or incomplete history of completing a 3-dose immunization series with Td-containing vaccines should begin or complete a primary immunization series including a Tdap dose. Adults should receive a Td booster every 10 years.  Varicella vaccine. An adult without evidence of immunity to varicella should receive 2 doses or a second dose if she has previously received 1 dose. Pregnant females who do not have evidence of immunity should receive the first dose after pregnancy. This first dose should be obtained before leaving the health care facility. The second dose should be obtained 4-8 weeks after the first dose.  Human papillomavirus (HPV) vaccine. Females aged 13-26 years who have not received the vaccine previously should obtain the 3-dose series. The vaccine is not recommended for use in pregnant females. However, pregnancy testing is not needed before receiving a dose. If a female is found to be pregnant after receiving a dose, no treatment is needed. In that case, the remaining doses should be delayed until after the pregnancy. Immunization is recommended for any person with an immunocompromised condition through the age of 26 years if she did not get any or all doses earlier. During the 3-dose series, the second dose should be obtained 4-8 weeks after the first dose. The third dose should be obtained 24 weeks after the first dose and 16 weeks after the second dose.  Zoster vaccine. One dose is recommended for adults aged 60 years or older unless certain conditions are  present.  Measles, mumps, and rubella (MMR) vaccine. Adults born before 1957 generally are considered immune to measles and mumps. Adults born in 1957 or later should have 1 or more doses of MMR vaccine unless there is a contraindication to the vaccine or there is laboratory evidence of immunity to   each of the three diseases. A routine second dose of MMR vaccine should be obtained at least 28 days after the first dose for students attending postsecondary schools, health care workers, or international travelers. People who received inactivated measles vaccine or an unknown type of measles vaccine during 1963-1967 should receive 2 doses of MMR vaccine. People who received inactivated mumps vaccine or an unknown type of mumps vaccine before 1979 and are at high risk for mumps infection should consider immunization with 2 doses of MMR vaccine. For females of childbearing age, rubella immunity should be determined. If there is no evidence of immunity, females who are not pregnant should be vaccinated. If there is no evidence of immunity, females who are pregnant should delay immunization until after pregnancy. Unvaccinated health care workers born before 1957 who lack laboratory evidence of measles, mumps, or rubella immunity or laboratory confirmation of disease should consider measles and mumps immunization with 2 doses of MMR vaccine or rubella immunization with 1 dose of MMR vaccine.  Pneumococcal 13-valent conjugate (PCV13) vaccine. When indicated, a person who is uncertain of her immunization history and has no record of immunization should receive the PCV13 vaccine. An adult aged 19 years or older who has certain medical conditions and has not been previously immunized should receive 1 dose of PCV13 vaccine. This PCV13 should be followed with a dose of pneumococcal polysaccharide (PPSV23) vaccine. The PPSV23 vaccine dose should be obtained at least 8 weeks after the dose of PCV13 vaccine. An adult aged 19  years or older who has certain medical conditions and previously received 1 or more doses of PPSV23 vaccine should receive 1 dose of PCV13. The PCV13 vaccine dose should be obtained 1 or more years after the last PPSV23 vaccine dose.  Pneumococcal polysaccharide (PPSV23) vaccine. When PCV13 is also indicated, PCV13 should be obtained first. All adults aged 65 years and older should be immunized. An adult younger than age 65 years who has certain medical conditions should be immunized. Any person who resides in a nursing home or long-term care facility should be immunized. An adult smoker should be immunized. People with an immunocompromised condition and certain other conditions should receive both PCV13 and PPSV23 vaccines. People with human immunodeficiency virus (HIV) infection should be immunized as soon as possible after diagnosis. Immunization during chemotherapy or radiation therapy should be avoided. Routine use of PPSV23 vaccine is not recommended for American Indians, [place] Natives, or people younger than 65 years unless there are medical conditions that require PPSV23 vaccine. When indicated, people who have unknown immunization and have no record of immunization should receive PPSV23 vaccine. One-time revaccination 5 years after the first dose of PPSV23 is recommended for people aged 19-64 years who have chronic kidney failure, nephrotic syndrome, asplenia, or immunocompromised conditions. People who received 1-2 doses of PPSV23 before age 65 years should receive another dose of PPSV23 vaccine at age 65 years or later if at least 5 years have passed since the previous dose. Doses of PPSV23 are not needed for people immunized with PPSV23 at or after age 65 years.  Meningococcal vaccine. Adults with asplenia or persistent complement component deficiencies should receive 2 doses of quadrivalent meningococcal conjugate (MenACWY-D) vaccine. The doses should be obtained at least 2 months apart.  Microbiologists working with certain meningococcal bacteria, military recruits, people at risk during an outbreak, and people who travel to or live in countries with a high rate of meningitis should be immunized. A first-year college student up through age   21 years who is living in a residence hall should receive a dose if she did not receive a dose on or after her 16th birthday. Adults who have certain high-risk conditions should receive one or more doses of vaccine.  Hepatitis A vaccine. Adults who wish to be protected from this disease, have certain high-risk conditions, work with hepatitis A-infected animals, work in hepatitis A research labs, or travel to or work in countries with a high rate of hepatitis A should be immunized. Adults who were previously unvaccinated and who anticipate close contact with an international adoptee during the first 60 days after arrival in the [place] from a country with a high rate of hepatitis A should be immunized.  Hepatitis B vaccine. Adults who wish to be protected from this disease, have certain high-risk conditions, may be exposed to blood or other infectious body fluids, are household contacts or sex partners of hepatitis B positive people, are clients or workers in certain care facilities, or travel to or work in countries with a high rate of hepatitis B should be immunized.  Haemophilus influenzae type b (Hib) vaccine. A previously unvaccinated person with asplenia or sickle cell disease or having a scheduled splenectomy should receive 1 dose of Hib vaccine. Regardless of previous immunization, a recipient of a hematopoietic stem cell transplant should receive a 3-dose series 6-12 months after her successful transplant. Hib vaccine is not recommended for adults with HIV infection. Preventive Services / Frequency  Ages 19 to 39 years 1. Blood pressure check. 2. Lipid and cholesterol check. 3. Clinical breast exam.** / Every 3 years for women in their  20s and 30s. 4. BRCA-related cancer risk assessment.** / For women who have family members with a BRCA-related cancer (breast, ovarian, tubal, or peritoneal cancers). 5. Pap test.** / Every 2 years from ages 21 through 29. Every 3 years starting at age 30 through age 65 or 70 with a history of 3 consecutive normal Pap tests. 6. HPV screening.** / Every 3 years from ages 30 through ages 65 to 70 with a history of 3 consecutive normal Pap tests. 7. Hepatitis C blood test.** / For any individual with known risks for hepatitis C. 8. Skin self-exam. / Monthly. 9. Influenza vaccine. / Every year. 10. Tetanus, diphtheria, and acellular pertussis (Tdap, Td) vaccine.** / Consult your health care provider. Pregnant women should receive 1 dose of Tdap vaccine during each pregnancy. 1 dose of Td every 10 years. 11. Varicella vaccine.** / Consult your health care provider. Pregnant females who do not have evidence of immunity should receive the first dose after pregnancy. 12. HPV vaccine. / 3 doses over 6 months, if 26 and younger. The vaccine is not recommended for use in pregnant females. However, pregnancy testing is not needed before receiving a dose. 13. Measles, mumps, rubella (MMR) vaccine.** / You need at least 1 dose of MMR if you were born in 1957 or later. You may also need a 2nd dose. For females of childbearing age, rubella immunity should be determined. If there is no evidence of immunity, females who are not pregnant should be vaccinated. If there is no evidence of immunity, females who are pregnant should delay immunization until after pregnancy. 14. Pneumococcal 13-valent conjugate (PCV13) vaccine.** / Consult your health care provider. 15. Pneumococcal polysaccharide (PPSV23) vaccine.** / 1 to 2 doses if you smoke cigarettes or if you have certain conditions. 16. Meningococcal vaccine.** / 1 dose if you are age 19 to 21 years and a   first-year college student living in a residence hall, or have one  of several medical conditions, you need to get vaccinated against meningococcal disease. You may also need additional booster doses. 17. Hepatitis A vaccine.** / Consult your health care provider. 18. Hepatitis B vaccine.** / Consult your health care provider. 19. Haemophilus influenzae type b (Hib) vaccine.** / Consult your health care provider.  24. Chlamydia, HIV, and other sexually transmitted diseases- Get screened every year until age 54, then within three months of each new sexual provider. 21. Pap Smear- Every 1-3 years; discuss with your health care provider. 28. Mammogram- Every year starting at age 40  Take these steps 1. Do not smoke-Your healthcare provider can help you quit.  For tips on how to quit go to www.smokefree.gov or call 1-800 QUITNOW. 2. Be physically active- Exercise 5 days a week for at least 30 minutes.  If you are not already physically active, start slow and gradually work up to 30 minutes of moderate physical activity.  Examples of moderate activity include walking briskly, dancing, swimming, bicycling, etc. 3. Breast Cancer- A self breast exam every month is important for early detection of breast cancer.  For more information and instruction on self breast exams, ask your healthcare provider or https://www.patel.info/. 4. Eat a healthy diet- Eat a variety of healthy foods such as fruits, vegetables, whole grains, low fat milk, low fat cheeses, yogurt, lean meats, poultry and fish, beans, nuts, tofu, etc.  For more information go to www. Thenutritionsource.org 5. Drink alcohol in moderation- Limit alcohol intake to one drink or less per day. Never drink and drive. 6. Depression- Your emotional health is as important as your physical health.  If you're feeling down or losing interest in things you normally enjoy please talk to your healthcare provider about being screened for depression. 7. Dental visit- Brush and floss your teeth twice daily;  visit your dentist twice a year. 8. Eye doctor- Get an eye exam at least every 2 years. 9. Helmet use- Always wear a helmet when riding a bicycle, motorcycle, rollerblading or skateboarding. 6. Safe sex- If you may be exposed to sexually transmitted infections, use a condom. 11. Seat belts- Seat belts can save your live; always wear one. 12. Smoke/Carbon Monoxide detectors- These detectors need to be installed on the appropriate level of your home. Replace batteries at least once a year. 13. Skin cancer- When out in the sun please cover up and use sunscreen 15 SPF or higher. 14. Violence- If anyone is threatening or hurting you, please tell your healthcare provider.

## 2014-07-19 LAB — LIPID PANEL
CHOLESTEROL: 147 mg/dL (ref 0–200)
HDL: 47 mg/dL (ref >=46)
LDL CALC: 88 mg/dL (ref 0–99)
TRIGLYCERIDES: 61 mg/dL (ref <150)
Total CHOL/HDL Ratio: 3.1 Ratio
VLDL: 12 mg/dL (ref 0–40)

## 2014-07-19 LAB — BASIC METABOLIC PANEL WITH GFR
BUN: 12 mg/dL (ref 6–23)
CHLORIDE: 104 meq/L (ref 96–112)
CO2: 29 mEq/L (ref 19–32)
CREATININE: 0.83 mg/dL (ref 0.50–1.10)
Calcium: 8.9 mg/dL (ref 8.4–10.5)
GFR, Est Non African American: >89 mL/min
GLUCOSE: 100 mg/dL — AB (ref 70–99)
POTASSIUM: 4.1 meq/L (ref 3.5–5.3)
Sodium: 138 mEq/L (ref 135–145)

## 2014-07-19 LAB — HEPATIC FUNCTION PANEL
ALBUMIN: 3.9 g/dL (ref 3.5–5.2)
ALK PHOS: 29 U/L — AB (ref 39–117)
ALT: 10 U/L (ref 0–35)
AST: 15 U/L (ref 0–37)
BILIRUBIN TOTAL: 0.3 mg/dL (ref 0.2–1.2)
Bilirubin, Direct: 0.1 mg/dL (ref 0.0–0.3)
Indirect Bilirubin: 0.2 mg/dL (ref 0.2–1.2)
TOTAL PROTEIN: 6.6 g/dL (ref 6.0–8.3)

## 2014-07-19 LAB — MAGNESIUM: MAGNESIUM: 1.9 mg/dL (ref 1.5–2.5)

## 2014-07-19 LAB — IRON AND TIBC
%SAT: 26 % (ref 20–55)
Iron: 87 ug/dL (ref 42–145)
TIBC: 332 ug/dL (ref 250–470)
UIBC: 245 ug/dL (ref 125–400)

## 2014-07-19 LAB — FERRITIN: FERRITIN: 32 ng/mL (ref 10–291)

## 2014-07-19 LAB — INSULIN, FASTING: Insulin fasting, serum: 4.9 u[IU]/mL (ref 2.0–19.6)

## 2014-07-19 LAB — TSH: TSH: 0.401 u[IU]/mL (ref 0.350–4.500)

## 2014-07-19 LAB — VITAMIN D 25 HYDROXY (VIT D DEFICIENCY, FRACTURES): VIT D 25 HYDROXY: 27 ng/mL — AB (ref 30–100)

## 2014-11-17 ENCOUNTER — Ambulatory Visit
Admission: RE | Admit: 2014-11-17 | Discharge: 2014-11-17 | Disposition: A | Discharge status: Home / Self Care | Payer: Managed Care, Other (non HMO) | Source: Ambulatory Visit | Attending: Family Medicine | Admitting: Family Medicine

## 2014-11-17 ENCOUNTER — Encounter: Payer: Self-pay | Admitting: Family Medicine

## 2014-11-17 ENCOUNTER — Ambulatory Visit (INDEPENDENT_AMBULATORY_CARE_PROVIDER_SITE_OTHER): Payer: Managed Care, Other (non HMO) | Admitting: Family Medicine

## 2014-11-17 VITALS — BP 100/60 | HR 69 | Wt 131.0 lb

## 2014-11-17 DIAGNOSIS — R0781 Pleurodynia: Secondary | ICD-10-CM

## 2014-11-17 DIAGNOSIS — B360 Pityriasis versicolor: Secondary | ICD-10-CM

## 2014-11-17 NOTE — Progress Notes (Signed)
   Subjective:    Patient ID: Courtney Wheeler, female    DOB: Dec 14, 1980, 34 y.o.   MRN: [id number]  HPI She complains of a three-week history of right lateral upper quadrant pain. The pain is made worse with breathing. She's had no chest congestion, shortness of breath, fever, chills. Motion has no effect. She does not notice any change with food. No nausea vomiting. She has not taken any medications. She does have questions over a rash present on her anterior chest area.   Review of Systems     Objective:   Physical Exam Urgent and in no distress. Cardiac exam shows regular rhythm without murmurs or gallops present lungs are clear to auscultation. Questionable lateral rib tenderness. Exam of her anterior chest does show patchy circular hypopigmented lesions. Chest x-ray is negative.      Assessment & Plan:  Rib pain on right side - Plan: DG Chest 2 View  Tinea versicolor Recommend watchful waiting for the rib pain. Also recommended proper posturing. She is to use Lamisil AF regularly for the next several weeks and then back off on it to keep the skin under control. Explained that this would probably recur every summer in hot wet environment.

## 2015-07-18 ENCOUNTER — Encounter: Payer: Self-pay | Admitting: Physician Assistant

## 2015-07-18 ENCOUNTER — Ambulatory Visit (INDEPENDENT_AMBULATORY_CARE_PROVIDER_SITE_OTHER): Payer: Managed Care, Other (non HMO) | Admitting: Physician Assistant

## 2015-07-18 ENCOUNTER — Other Ambulatory Visit: Payer: Self-pay

## 2015-07-18 VITALS — BP 110/80 | HR 91 | Temp 97.3°F | Resp 16 | Ht 65.0 in | Wt 133.2 lb

## 2015-07-18 DIAGNOSIS — R7303 Prediabetes: Secondary | ICD-10-CM | POA: Diagnosis not present

## 2015-07-18 DIAGNOSIS — Z0000 Encounter for general adult medical examination without abnormal findings: Secondary | ICD-10-CM

## 2015-07-18 DIAGNOSIS — Z13220 Encounter for screening for lipoid disorders: Secondary | ICD-10-CM

## 2015-07-18 DIAGNOSIS — Z131 Encounter for screening for diabetes mellitus: Secondary | ICD-10-CM

## 2015-07-18 DIAGNOSIS — D649 Anemia, unspecified: Secondary | ICD-10-CM | POA: Diagnosis not present

## 2015-07-18 DIAGNOSIS — E559 Vitamin D deficiency, unspecified: Secondary | ICD-10-CM

## 2015-07-18 DIAGNOSIS — E349 Endocrine disorder, unspecified: Secondary | ICD-10-CM

## 2015-07-18 DIAGNOSIS — R6889 Other general symptoms and signs: Secondary | ICD-10-CM

## 2015-07-18 DIAGNOSIS — Z0001 Encounter for general adult medical examination with abnormal findings: Secondary | ICD-10-CM | POA: Diagnosis not present

## 2015-07-18 DIAGNOSIS — Z1389 Encounter for screening for other disorder: Secondary | ICD-10-CM

## 2015-07-18 DIAGNOSIS — Z79899 Other long term (current) drug therapy: Secondary | ICD-10-CM

## 2015-07-18 LAB — CBC WITH DIFFERENTIAL/PLATELET
BASOS PCT: 1 %
Basophils Absolute: 56 cells/uL (ref 0–200)
Eosinophils Absolute: 56 cells/uL (ref 15–500)
Eosinophils Relative: 1 %
HCT: 41.5 % (ref 35.0–45.0)
Hemoglobin: 13.7 g/dL (ref 11.7–15.5)
LYMPHS PCT: 34 %
Lymphs Abs: 1904 cells/uL (ref 850–3900)
MCH: 32.5 pg (ref 27.0–33.0)
MCHC: 33 g/dL (ref 32.0–36.0)
MCV: 98.6 fL (ref 80.0–100.0)
MONOS PCT: 10 %
MPV: 9.2 fL (ref 7.5–12.5)
Monocytes Absolute: 560 cells/uL (ref 200–950)
NEUTROS PCT: 54 %
Neutro Abs: 3024 cells/uL (ref 1500–7800)
PLATELETS: 249 10*3/uL (ref 140–400)
RBC: 4.21 MIL/uL (ref 3.80–5.10)
RDW: 12.8 % (ref 11.0–15.0)
WBC: 5.6 10*3/uL (ref 3.8–10.8)

## 2015-07-18 NOTE — Progress Notes (Signed)
Complete Physical  Assessment and Plan: 1. Prediabetes - Hemoglobin A1c - Insulin, fasting  2. Vitamin D deficiency - VITAMIN D 25 Hydroxy (Vit-D Deficiency, Fractures)  3. Anemia, unspecified anemia type - monitor, continue iron supp with Vitamin C and increase green leafy veggies  4. Medication management - CBC with Differential/Platelet - BASIC METABOLIC PANEL WITH GFR - Hepatic function panel - TSH - Magnesium  5. Screening cholesterol level - Lipid panel  6. Screening for blood or protein in urine - Urinalysis, Routine w reflex microscopic (not at [facility]) - Microalbumin / creatinine urine ratio  7. Routine general medical examination at a health care facility - CBC with Differential/Platelet - BASIC METABOLIC PANEL WITH GFR - Hepatic function panel - TSH - Lipid panel - Hemoglobin A1c - Insulin, fasting - Magnesium - VITAMIN D 25 Hydroxy (Vit-D Deficiency, Fractures) - Urinalysis, Routine w reflex microscopic (not at [facility]) - Microalbumin / creatinine urine ratio - Follicle stimulating hormone - Estrogens, Total  8. Hormone imbalance VERSUS depression - Follicle stimulating hormone - Estrogens, Total - declines meds, interested in genetic testing if needed to go on medication  Discussed med's effects and SE's. Screening labs and tests as requested with regular follow-up as recommended. Over 40 minutes of exam, counseling, chart review, and critical decision making was performed this visit.   HPI  35 y.o. female  presents for a complete physical.  Her blood pressure has been controlled at home, today their BP is BP: 110/80 mmHg She does workout. She denies chest pain, shortness of breath, dizziness.  She is not on cholesterol medication and denies myalgias. Her cholesterol is at goal. The cholesterol last visit was:   Lab Results  Component Value Date   CHOL 147 07/18/2014   HDL 47 07/18/2014   LDLCALC 88 07/18/2014   TRIG 61 07/18/2014   CHOLHDL 3.1  07/18/2014  She has had elevated insulin in the past. Most recent A1C in the office was:  Lab Results  Component Value Date   HGBA1C 5.3 06/29/2013  Patient is on Vitamin D supplement, 10000 IU a day.   Lab Results  Component Value Date   VD25OH 42* 07/18/2014  She has a history of ectopic pregnancy 2013 s/p fallopion tube removal, follows with Dr. Rosemary Holms. She is now on skyla and states that she bleed for her first 6 months on it and was put on pills in addition to the skyla and had very bad depression, crying, headaches, hot flashes, and insomnia and aggression with the pills and has been better since being off of them x march 6th.  Mother is here with her and states that her sister had early menopause and she is concerned she has this.   Wt Readings from Last 3 Encounters:  07/18/15 133 lb 3.2 oz (60.419 kg)  11/17/14 131 lb (59.421 kg)  07/18/14 132 lb (59.875 kg)      Works in [employer], [employer].  Current Medications:  Current Outpatient Prescriptions on File Prior to Visit  Medication Sig Dispense Refill  . b complex vitamins tablet Take 1 tablet by mouth daily.    . cholecalciferol (VITAMIN D) 1000 UNITS tablet Take 1,000 Units by mouth daily.    . Levonorgestrel (SKYLA) 13.5 MG IUD by Intrauterine route.     No current facility-administered medications on file prior to visit.   Health Maintenance:   Immunization History  Administered Date(s) Administered  . Tdap 06/28/2012   Tetanus: 2014 Pneumovax: N/A Flu vaccine: N/A Zostavax: N/A  LMP: March, recent abortion Pap: 07/14/2014 MGM:  Feb 2016 normal, dense breast   DEXA: N/A Colonoscopy: N/A EGD: N/A CT AB 2013 Negative Hep panel 06/2011 Last Dental Exam: Last Eye Exam:  Patient Care Team: Ronnald Nian, MD as PCP - General (Family Medicine)  Allergies:  Allergies  Allergen Reactions  . Penicillins Hives   Medical History:  Past Medical History  Diagnosis Date  . Allergy     RHINITIS  . Heart murmur   .  Seizure disorder (HCC)     5th grade  . Anemia   . Blood transfusion   . Ectopic pregnancy 03/2011    ruptured just prior to having abortion  . Vitamin D deficiency   . Prediabetes    Surgical History:  Past Surgical History  Procedure Laterality Date  . Dilation and curettage of uterus  03/2011    abortion  . Laparotomy  04/17/2011    Procedure: EXPLORATORY LAPAROTOMY;  Surgeon: Serita Kyle, MD;  Location: [facility];  Service: Gynecology;;   Family History:  Family History  Problem Relation Age of Onset  . Mental illness Maternal Aunt   . Heart disease Maternal Uncle   . Diabetes Maternal Grandmother   . Hypertension Maternal Grandmother   . Arthritis Paternal Grandmother   . Asthma Paternal Grandmother   . Birth defects Paternal Grandmother   . Hypertension Paternal Grandmother   . Cancer Paternal Grandfather     bladder  . Heart disease Paternal Grandfather   . Hypertension Paternal Grandfather   . Stroke Paternal Grandfather   . Cancer Cousin     breast   Social History:  Social History  Substance Use Topics  . Smoking status: Current Some Day Smoker    Types: E-cigarettes    Last Attempt to Quit: 04/25/2011  . Smokeless tobacco: Never Used  . Alcohol Use: Yes     Comment: 4 drinks a week   Review of Systems: Review of Systems  Constitutional: Positive for malaise/fatigue. Negative for fever, chills, weight loss and diaphoresis.  HENT: Negative.   Eyes: Negative.   Respiratory: Negative.   Cardiovascular: Negative.   Gastrointestinal: Negative for heartburn, nausea, vomiting, abdominal pain, diarrhea, constipation, blood in stool and melena.  Genitourinary: Negative.   Musculoskeletal: Negative.   Skin: Negative.   Neurological: Negative.  Negative for weakness.  Endo/Heme/Allergies: Negative.   Psychiatric/Behavioral: Negative for depression, suicidal ideas, hallucinations, memory loss and substance abuse. The patient is nervous/anxious. The patient  does not have insomnia.     Physical Exam: Estimated body mass index is 22.17 kg/(m^2) as calculated from the following:   Height as of this encounter:  (1.651 m).   Weight as of this encounter: 133 lb 3.2 oz (60.419 kg). BP 110/80 mmHg  Pulse 91  Temp(Src) 97.3 F (36.3 C) (Temporal)  Resp 16  Ht  (1.651 m)  Wt 133 lb 3.2 oz (60.419 kg)  BMI 22.17 kg/m2  SpO2 99%  LMP 06/29/2015 General Appearance: Well nourished, in no apparent distress.  Eyes: PERRLA, EOMs, conjunctiva no swelling or erythema, normal fundi and vessels.  Sinuses: No Frontal/maxillary tenderness  ENT/Mouth: Ext aud canals clear, normal light reflex with TMs without erythema, bulging. Good dentition. No erythema, swelling, or exudate on post pharynx. Tonsils not swollen or erythematous. Hearing normal.  Neck: Supple, thyroid normal. No bruits  Respiratory: Respiratory effort normal, BS equal bilaterally without rales, rhonchi, wheezing or stridor.  Cardio: RRR without murmurs, rubs or gallops. Brisk  peripheral pulses without edema.  Chest: symmetric, with normal excursions and percussion.  Breasts: defer Abdomen: Soft, slight lower AB tenderness, no guarding, rebound, hernias, masses, or organomegaly.  Lymphatics: Non tender without lymphadenopathy.  Genitourinary: defer Musculoskeletal: Full ROM all peripheral extremities,5/5 strength, and normal gait.  Skin: Warm, dry without rashes, lesions, ecchymosis. Neuro: Cranial nerves intact, reflexes equal bilaterally. Normal muscle tone, no cerebellar symptoms. Sensation intact.  Psych: Awake and oriented X 3, normal affect, Insight and Judgment appropriate.   EKG: declines AORTA SCAN: declines  Quentin MullingAmanda Joycie Aerts 9:16 AM [facility]

## 2015-07-18 NOTE — Patient Instructions (Signed)
Preventive Care for Adults A healthy lifestyle and preventive care can promote health and wellness. Preventive health guidelines for women include the following key practices.  A routine yearly physical is a good way to check with your health care provider about your health and preventive screening. It is a chance to share any concerns and updates on your health and to receive a thorough exam.  Visit your dentist for a routine exam and preventive care every 6 months. Brush your teeth twice a day and floss once a day. Good oral hygiene prevents tooth decay and gum disease.  The frequency of eye exams is based on your age, health, family medical history, use of contact lenses, and other factors. Follow your health care provider's recommendations for frequency of eye exams.  Eat a healthy diet. Foods like vegetables, fruits, whole grains, low-fat dairy products, and lean protein foods contain the nutrients you need without too many calories. Decrease your intake of foods high in solid fats, added sugars, and salt. Eat the right amount of calories for you.Get information about a proper diet from your health care provider, if necessary.  Regular physical exercise is one of the most important things you can do for your health. Most adults should get at least 150 minutes of moderate-intensity exercise (any activity that increases your heart rate and causes you to sweat) each week. In addition, most adults need muscle-strengthening exercises on 2 or more days a week.  Maintain a healthy weight. The body mass index (BMI) is a screening tool to identify possible weight problems. It provides an estimate of body fat based on height and weight. Your health care provider can find your BMI and can help you achieve or maintain a healthy weight.For adults 20 years and older:  A BMI below 18.5 is considered underweight.  A BMI of 18.5 to 24.9 is normal.  A BMI of 25 to 29.9 is considered overweight.  A BMI of  30 and above is considered obese.  Maintain normal blood lipids and cholesterol levels by exercising and minimizing your intake of saturated fat. Eat a balanced diet with plenty of fruit and vegetables. Blood tests for lipids and cholesterol should begin at age 76 and be repeated every 5 years. If your lipid or cholesterol levels are high, you are over 50, or you are at high risk for heart disease, you may need your cholesterol levels checked more frequently.Ongoing high lipid and cholesterol levels should be treated with medicines if diet and exercise are not working.  If you smoke, find out from your health care provider how to quit. If you do not use tobacco, do not start.  Lung cancer screening is recommended for adults aged 22-80 years who are at high risk for developing lung cancer because of a history of smoking. A yearly low-dose CT scan of the lungs is recommended for people who have at least a 30-pack-year history of smoking and are a current smoker or have quit within the past 15 years. A pack year of smoking is smoking an average of 1 pack of cigarettes a day for 1 year (for example: 1 pack a day for 30 years or 2 packs a day for 15 years). Yearly screening should continue until the smoker has stopped smoking for at least 15 years. Yearly screening should be stopped for people who develop a health problem that would prevent them from having lung cancer treatment.  If you are pregnant, do not drink alcohol. If you are breastfeeding,  be very cautious about drinking alcohol. If you are not pregnant and choose to drink alcohol, do not have more than 1 drink per day. One drink is considered to be 12 ounces (355 mL) of beer, 5 ounces (148 mL) of wine, or 1.5 ounces (44 mL) of liquor.  Avoid use of street drugs. Do not share needles with anyone. Ask for help if you need support or instructions about stopping the use of drugs.  High blood pressure causes heart disease and increases the risk of  stroke. Your blood pressure should be checked at least every 1 to 2 years. Ongoing high blood pressure should be treated with medicines if weight loss and exercise do not work.  If you are 3-86 years old, ask your health care provider if you should take aspirin to prevent strokes.  Diabetes screening involves taking a blood sample to check your fasting blood sugar level. This should be done once every 3 years, after age 67, if you are within normal weight and without risk factors for diabetes. Testing should be considered at a younger age or be carried out more frequently if you are overweight and have at least 1 risk factor for diabetes.  Breast cancer screening is essential preventive care for women. You should practice "breast self-awareness." This means understanding the normal appearance and feel of your breasts and may include breast self-examination. Any changes detected, no matter how small, should be reported to a health care provider. Women in their 8s and 30s should have a clinical breast exam (CBE) by a health care provider as part of a regular health exam every 1 to 3 years. After age 70, women should have a CBE every year. Starting at age 25, women should consider having a mammogram (breast X-ray test) every year. Women who have a family history of breast cancer should talk to their health care provider about genetic screening. Women at a high risk of breast cancer should talk to their health care providers about having an MRI and a mammogram every year.  Breast cancer gene (BRCA)-related cancer risk assessment is recommended for women who have family members with BRCA-related cancers. BRCA-related cancers include breast, ovarian, tubal, and peritoneal cancers. Having family members with these cancers may be associated with an increased risk for harmful changes (mutations) in the breast cancer genes BRCA1 and BRCA2. Results of the assessment will determine the need for genetic counseling and  BRCA1 and BRCA2 testing.  Routine pelvic exams to screen for cancer are no longer recommended for nonpregnant women who are considered low risk for cancer of the pelvic organs (ovaries, uterus, and vagina) and who do not have symptoms. Ask your health care provider if a screening pelvic exam is right for you.  If you have had past treatment for cervical cancer or a condition that could lead to cancer, you need Pap tests and screening for cancer for at least 20 years after your treatment. If Pap tests have been discontinued, your risk factors (such as having a new sexual partner) need to be reassessed to determine if screening should be resumed. Some women have medical problems that increase the chance of getting cervical cancer. In these cases, your health care provider may recommend more frequent screening and Pap tests.  The HPV test is an additional test that may be used for cervical cancer screening. The HPV test looks for the virus that can cause the cell changes on the cervix. The cells collected during the Pap test can be  tested for HPV. The HPV test could be used to screen women aged 30 years and older, and should be used in women of any age who have unclear Pap test results. After the age of 30, women should have HPV testing at the same frequency as a Pap test.  Colorectal cancer can be detected and often prevented. Most routine colorectal cancer screening begins at the age of 50 years and continues through age 75 years. However, your health care provider may recommend screening at an earlier age if you have risk factors for colon cancer. On a yearly basis, your health care provider may provide home test kits to check for hidden blood in the stool. Use of a small camera at the end of a tube, to directly examine the colon (sigmoidoscopy or colonoscopy), can detect the earliest forms of colorectal cancer. Talk to your health care provider about this at age 50, when routine screening begins. Direct  exam of the colon should be repeated every 5-10 years through age 75 years, unless early forms of pre-cancerous polyps or small growths are found.  People who are at an increased risk for hepatitis B should be screened for this virus. You are considered at high risk for hepatitis B if:  You were born in a country where hepatitis B occurs often. Talk with your health care provider about which countries are considered high risk.  Your parents were born in a high-risk country and you have not received a shot to protect against hepatitis B (hepatitis B vaccine).  You have HIV or AIDS.  You use needles to inject street drugs.  You live with, or have sex with, someone who has hepatitis B.  You get hemodialysis treatment.  You take certain medicines for conditions like cancer, organ transplantation, and autoimmune conditions.  Hepatitis C blood testing is recommended for all people born from 1945 through 1965 and any individual with known risks for hepatitis C.  Practice safe sex. Use condoms and avoid high-risk sexual practices to reduce the spread of sexually transmitted infections (STIs). STIs include gonorrhea, chlamydia, syphilis, trichomonas, herpes, HPV, and human immunodeficiency virus (HIV). Herpes, HIV, and HPV are viral illnesses that have no cure. They can result in disability, cancer, and death.  You should be screened for sexually transmitted illnesses (STIs) including gonorrhea and chlamydia if:  You are sexually active and are younger than 24 years.  You are older than 24 years and your health care provider tells you that you are at risk for this type of infection.  Your sexual activity has changed since you were last screened and you are at an increased risk for chlamydia or gonorrhea. Ask your health care provider if you are at risk.  If you are at risk of being infected with HIV, it is recommended that you take a prescription medicine daily to prevent HIV infection. This is  called preexposure prophylaxis (PrEP). You are considered at risk if:  You are a heterosexual woman, are sexually active, and are at increased risk for HIV infection.  You take drugs by injection.  You are sexually active with a partner who has HIV.  Talk with your health care provider about whether you are at high risk of being infected with HIV. If you choose to begin PrEP, you should first be tested for HIV. You should then be tested every 3 months for as long as you are taking PrEP.  Osteoporosis is a disease in which the bones lose minerals and strength   with aging. This can result in serious bone fractures or breaks. The risk of osteoporosis can be identified using a bone density scan. Women ages 65 years and over and women at risk for fractures or osteoporosis should discuss screening with their health care providers. Ask your health care provider whether you should take a calcium supplement or vitamin D to reduce the rate of osteoporosis.  Menopause can be associated with physical symptoms and risks. Hormone replacement therapy is available to decrease symptoms and risks. You should talk to your health care provider about whether hormone replacement therapy is right for you.  Use sunscreen. Apply sunscreen liberally and repeatedly throughout the day. You should seek shade when your shadow is shorter than you. Protect yourself by wearing long sleeves, pants, a wide-brimmed hat, and sunglasses year round, whenever you are outdoors.  Once a month, do a whole body skin exam, using a mirror to look at the skin on your back. Tell your health care provider of new moles, moles that have irregular borders, moles that are larger than a pencil eraser, or moles that have changed in shape or color.  Stay current with required vaccines (immunizations).  Influenza vaccine. All adults should be immunized every year.  Tetanus, diphtheria, and acellular pertussis (Td, Tdap) vaccine. Pregnant women should  receive 1 dose of Tdap vaccine during each pregnancy. The dose should be obtained regardless of the length of time since the last dose. Immunization is preferred during the 27th-36th week of gestation. An adult who has not previously received Tdap or who does not know her vaccine status should receive 1 dose of Tdap. This initial dose should be followed by tetanus and diphtheria toxoids (Td) booster doses every 10 years. Adults with an unknown or incomplete history of completing a 3-dose immunization series with Td-containing vaccines should begin or complete a primary immunization series including a Tdap dose. Adults should receive a Td booster every 10 years.  Varicella vaccine. An adult without evidence of immunity to varicella should receive 2 doses or a second dose if she has previously received 1 dose. Pregnant females who do not have evidence of immunity should receive the first dose after pregnancy. This first dose should be obtained before leaving the health care facility. The second dose should be obtained 4-8 weeks after the first dose.  Human papillomavirus (HPV) vaccine. Females aged 13-26 years who have not received the vaccine previously should obtain the 3-dose series. The vaccine is not recommended for use in pregnant females. However, pregnancy testing is not needed before receiving a dose. If a female is found to be pregnant after receiving a dose, no treatment is needed. In that case, the remaining doses should be delayed until after the pregnancy. Immunization is recommended for any person with an immunocompromised condition through the age of 26 years if she did not get any or all doses earlier. During the 3-dose series, the second dose should be obtained 4-8 weeks after the first dose. The third dose should be obtained 24 weeks after the first dose and 16 weeks after the second dose.  Zoster vaccine. One dose is recommended for adults aged 60 years or older unless certain conditions are  present.  Measles, mumps, and rubella (MMR) vaccine. Adults born before 1957 generally are considered immune to measles and mumps. Adults born in 1957 or later should have 1 or more doses of MMR vaccine unless there is a contraindication to the vaccine or there is laboratory evidence of immunity to   each of the three diseases. A routine second dose of MMR vaccine should be obtained at least 28 days after the first dose for students attending postsecondary schools, health care workers, or international travelers. People who received inactivated measles vaccine or an unknown type of measles vaccine during 1963-1967 should receive 2 doses of MMR vaccine. People who received inactivated mumps vaccine or an unknown type of mumps vaccine before 1979 and are at high risk for mumps infection should consider immunization with 2 doses of MMR vaccine. For females of childbearing age, rubella immunity should be determined. If there is no evidence of immunity, females who are not pregnant should be vaccinated. If there is no evidence of immunity, females who are pregnant should delay immunization until after pregnancy. Unvaccinated health care workers born before 1957 who lack laboratory evidence of measles, mumps, or rubella immunity or laboratory confirmation of disease should consider measles and mumps immunization with 2 doses of MMR vaccine or rubella immunization with 1 dose of MMR vaccine.  Pneumococcal 13-valent conjugate (PCV13) vaccine. When indicated, a person who is uncertain of her immunization history and has no record of immunization should receive the PCV13 vaccine. An adult aged 19 years or older who has certain medical conditions and has not been previously immunized should receive 1 dose of PCV13 vaccine. This PCV13 should be followed with a dose of pneumococcal polysaccharide (PPSV23) vaccine. The PPSV23 vaccine dose should be obtained at least 8 weeks after the dose of PCV13 vaccine. An adult aged 19  years or older who has certain medical conditions and previously received 1 or more doses of PPSV23 vaccine should receive 1 dose of PCV13. The PCV13 vaccine dose should be obtained 1 or more years after the last PPSV23 vaccine dose.  Pneumococcal polysaccharide (PPSV23) vaccine. When PCV13 is also indicated, PCV13 should be obtained first. All adults aged 65 years and older should be immunized. An adult younger than age 65 years who has certain medical conditions should be immunized. Any person who resides in a nursing home or long-term care facility should be immunized. An adult smoker should be immunized. People with an immunocompromised condition and certain other conditions should receive both PCV13 and PPSV23 vaccines. People with human immunodeficiency virus (HIV) infection should be immunized as soon as possible after diagnosis. Immunization during chemotherapy or radiation therapy should be avoided. Routine use of PPSV23 vaccine is not recommended for American Indians, [place] Natives, or people younger than 65 years unless there are medical conditions that require PPSV23 vaccine. When indicated, people who have unknown immunization and have no record of immunization should receive PPSV23 vaccine. One-time revaccination 5 years after the first dose of PPSV23 is recommended for people aged 19-64 years who have chronic kidney failure, nephrotic syndrome, asplenia, or immunocompromised conditions. People who received 1-2 doses of PPSV23 before age 65 years should receive another dose of PPSV23 vaccine at age 65 years or later if at least 5 years have passed since the previous dose. Doses of PPSV23 are not needed for people immunized with PPSV23 at or after age 65 years.  Meningococcal vaccine. Adults with asplenia or persistent complement component deficiencies should receive 2 doses of quadrivalent meningococcal conjugate (MenACWY-D) vaccine. The doses should be obtained at least 2 months apart.  Microbiologists working with certain meningococcal bacteria, military recruits, people at risk during an outbreak, and people who travel to or live in countries with a high rate of meningitis should be immunized. A first-year college student up through age   21 years who is living in a residence hall should receive a dose if she did not receive a dose on or after her 16th birthday. Adults who have certain high-risk conditions should receive one or more doses of vaccine.  Hepatitis A vaccine. Adults who wish to be protected from this disease, have certain high-risk conditions, work with hepatitis A-infected animals, work in hepatitis A research labs, or travel to or work in countries with a high rate of hepatitis A should be immunized. Adults who were previously unvaccinated and who anticipate close contact with an international adoptee during the first 60 days after arrival in the [place] from a country with a high rate of hepatitis A should be immunized.  Hepatitis B vaccine. Adults who wish to be protected from this disease, have certain high-risk conditions, may be exposed to blood or other infectious body fluids, are household contacts or sex partners of hepatitis B positive people, are clients or workers in certain care facilities, or travel to or work in countries with a high rate of hepatitis B should be immunized.  Haemophilus influenzae type b (Hib) vaccine. A previously unvaccinated person with asplenia or sickle cell disease or having a scheduled splenectomy should receive 1 dose of Hib vaccine. Regardless of previous immunization, a recipient of a hematopoietic stem cell transplant should receive a 3-dose series 6-12 months after her successful transplant. Hib vaccine is not recommended for adults with HIV infection. Preventive Services / Frequency  Ages 19 to 39 years 1. Blood pressure check. 2. Lipid and cholesterol check. 3. Clinical breast exam.** / Every 3 years for women in their  20s and 30s. 4. BRCA-related cancer risk assessment.** / For women who have family members with a BRCA-related cancer (breast, ovarian, tubal, or peritoneal cancers). 5. Pap test.** / Every 2 years from ages 21 through 29. Every 3 years starting at age 30 through age 65 or 70 with a history of 3 consecutive normal Pap tests. 6. HPV screening.** / Every 3 years from ages 30 through ages 65 to 70 with a history of 3 consecutive normal Pap tests. 7. Hepatitis C blood test.** / For any individual with known risks for hepatitis C. 8. Skin self-exam. / Monthly. 9. Influenza vaccine. / Every year. 10. Tetanus, diphtheria, and acellular pertussis (Tdap, Td) vaccine.** / Consult your health care provider. Pregnant women should receive 1 dose of Tdap vaccine during each pregnancy. 1 dose of Td every 10 years. 11. Varicella vaccine.** / Consult your health care provider. Pregnant females who do not have evidence of immunity should receive the first dose after pregnancy. 12. HPV vaccine. / 3 doses over 6 months, if 26 and younger. The vaccine is not recommended for use in pregnant females. However, pregnancy testing is not needed before receiving a dose. 13. Measles, mumps, rubella (MMR) vaccine.** / You need at least 1 dose of MMR if you were born in 1957 or later. You may also need a 2nd dose. For females of childbearing age, rubella immunity should be determined. If there is no evidence of immunity, females who are not pregnant should be vaccinated. If there is no evidence of immunity, females who are pregnant should delay immunization until after pregnancy. 14. Pneumococcal 13-valent conjugate (PCV13) vaccine.** / Consult your health care provider. 15. Pneumococcal polysaccharide (PPSV23) vaccine.** / 1 to 2 doses if you smoke cigarettes or if you have certain conditions. 16. Meningococcal vaccine.** / 1 dose if you are age 19 to 21 years and a   first-year college student living in a residence hall, or have one  of several medical conditions, you need to get vaccinated against meningococcal disease. You may also need additional booster doses. 17. Hepatitis A vaccine.** / Consult your health care provider. 18. Hepatitis B vaccine.** / Consult your health care provider. 19. Haemophilus influenzae type b (Hib) vaccine.** / Consult your health care provider.  24. Chlamydia, HIV, and other sexually transmitted diseases- Get screened every year until age 54, then within three months of each new sexual provider. 21. Pap Smear- Every 1-3 years; discuss with your health care provider. 28. Mammogram- Every year starting at age 40  Take these steps 1. Do not smoke-Your healthcare provider can help you quit.  For tips on how to quit go to www.smokefree.gov or call 1-800 QUITNOW. 2. Be physically active- Exercise 5 days a week for at least 30 minutes.  If you are not already physically active, start slow and gradually work up to 30 minutes of moderate physical activity.  Examples of moderate activity include walking briskly, dancing, swimming, bicycling, etc. 3. Breast Cancer- A self breast exam every month is important for early detection of breast cancer.  For more information and instruction on self breast exams, ask your healthcare provider or https://www.patel.info/. 4. Eat a healthy diet- Eat a variety of healthy foods such as fruits, vegetables, whole grains, low fat milk, low fat cheeses, yogurt, lean meats, poultry and fish, beans, nuts, tofu, etc.  For more information go to www. Thenutritionsource.org 5. Drink alcohol in moderation- Limit alcohol intake to one drink or less per day. Never drink and drive. 6. Depression- Your emotional health is as important as your physical health.  If you're feeling down or losing interest in things you normally enjoy please talk to your healthcare provider about being screened for depression. 7. Dental visit- Brush and floss your teeth twice daily;  visit your dentist twice a year. 8. Eye doctor- Get an eye exam at least every 2 years. 9. Helmet use- Always wear a helmet when riding a bicycle, motorcycle, rollerblading or skateboarding. 6. Safe sex- If you may be exposed to sexually transmitted infections, use a condom. 11. Seat belts- Seat belts can save your live; always wear one. 12. Smoke/Carbon Monoxide detectors- These detectors need to be installed on the appropriate level of your home. Replace batteries at least once a year. 13. Skin cancer- When out in the sun please cover up and use sunscreen 15 SPF or higher. 14. Violence- If anyone is threatening or hurting you, please tell your healthcare provider.

## 2015-07-19 ENCOUNTER — Encounter: Payer: Self-pay | Admitting: Internal Medicine

## 2015-07-19 ENCOUNTER — Encounter: Payer: Self-pay | Admitting: Physician Assistant

## 2015-07-19 LAB — LIPID PANEL
CHOLESTEROL: 205 mg/dL — AB (ref 125–200)
HDL: 68 mg/dL (ref >=46)
LDL CALC: 117 mg/dL (ref <130)
TRIGLYCERIDES: 98 mg/dL (ref <150)
Total CHOL/HDL Ratio: 3 Ratio (ref <=5.0)
VLDL: 20 mg/dL (ref <30)

## 2015-07-19 LAB — HEPATIC FUNCTION PANEL
ALBUMIN: 4.4 g/dL (ref 3.6–5.1)
ALK PHOS: 34 U/L (ref 33–115)
ALT: 15 U/L (ref 6–29)
AST: 22 U/L (ref 10–30)
BILIRUBIN TOTAL: 0.3 mg/dL (ref 0.2–1.2)
Bilirubin, Direct: 0.1 mg/dL (ref <=0.2)
Indirect Bilirubin: 0.2 mg/dL (ref 0.2–1.2)
TOTAL PROTEIN: 7.7 g/dL (ref 6.1–8.1)

## 2015-07-19 LAB — URINALYSIS, ROUTINE W REFLEX MICROSCOPIC
Bilirubin Urine: NEGATIVE
Glucose, UA: NEGATIVE
HGB URINE DIPSTICK: NEGATIVE
KETONES UR: NEGATIVE
LEUKOCYTES UA: NEGATIVE
NITRITE: NEGATIVE
PROTEIN: NEGATIVE
Specific Gravity, Urine: 1.01 (ref 1.001–1.035)
pH: 7 (ref 5.0–8.0)

## 2015-07-19 LAB — BASIC METABOLIC PANEL WITH GFR
BUN: 22 mg/dL (ref 7–25)
CALCIUM: 9.4 mg/dL (ref 8.6–10.2)
CO2: 22 mmol/L (ref 20–31)
Chloride: 103 mmol/L (ref 98–110)
Creat: 0.87 mg/dL (ref 0.50–1.10)
GFR, EST NON AFRICAN AMERICAN: 86 mL/min (ref >=60)
Glucose, Bld: 74 mg/dL (ref 65–99)
POTASSIUM: 4.5 mmol/L (ref 3.5–5.3)
Sodium: 138 mmol/L (ref 135–146)

## 2015-07-19 LAB — TSH: TSH: 0.77 mIU/L

## 2015-07-19 LAB — HEMOGLOBIN A1C
Hgb A1c MFr Bld: 5.2 % (ref <5.7)
Mean Plasma Glucose: 103 mg/dL

## 2015-07-19 LAB — MICROALBUMIN / CREATININE URINE RATIO: CREATININE, URINE: 41 mg/dL (ref 20–320)

## 2015-07-19 LAB — FOLLICLE STIMULATING HORMONE: FSH: 3.3 m[IU]/mL

## 2015-07-19 LAB — INSULIN, FASTING: INSULIN FASTING, SERUM: 4.5 u[IU]/mL (ref 2.0–19.6)

## 2015-07-19 LAB — MAGNESIUM: MAGNESIUM: 2.1 mg/dL (ref 1.5–2.5)

## 2015-07-19 LAB — VITAMIN D 25 HYDROXY (VIT D DEFICIENCY, FRACTURES): VIT D 25 HYDROXY: 87 ng/mL (ref 30–100)

## 2015-07-22 LAB — ESTROGENS, TOTAL: Estrogen: 219.6 pg/mL

## 2016-07-18 ENCOUNTER — Encounter: Payer: Self-pay | Admitting: Medical

## 2016-07-18 ENCOUNTER — Ambulatory Visit (INDEPENDENT_AMBULATORY_CARE_PROVIDER_SITE_OTHER): Payer: Managed Care, Other (non HMO) | Admitting: Medical

## 2016-07-18 VITALS — BP 104/60 | HR 59 | Wt 136.0 lb

## 2016-07-18 DIAGNOSIS — I7300 Raynaud's syndrome without gangrene: Secondary | ICD-10-CM

## 2016-07-18 DIAGNOSIS — R202 Paresthesia of skin: Secondary | ICD-10-CM

## 2016-07-18 DIAGNOSIS — R4781 Slurred speech: Secondary | ICD-10-CM | POA: Diagnosis not present

## 2016-07-18 DIAGNOSIS — L819 Disorder of pigmentation, unspecified: Secondary | ICD-10-CM | POA: Diagnosis not present

## 2016-07-18 LAB — BASIC METABOLIC PANEL
BUN: 18 mg/dL (ref 7–25)
CHLORIDE: 104 mmol/L (ref 98–110)
CO2: 25 mmol/L (ref 20–31)
Calcium: 8.7 mg/dL (ref 8.6–10.2)
Creat: 1.15 mg/dL — ABNORMAL HIGH (ref 0.50–1.10)
GLUCOSE: 95 mg/dL (ref 65–99)
Potassium: 4.3 mmol/L (ref 3.5–5.3)
SODIUM: 138 mmol/L (ref 135–146)

## 2016-07-18 LAB — CBC WITH DIFFERENTIAL/PLATELET
BASOS ABS: 71 {cells}/uL (ref 0–200)
BASOS PCT: 1 %
EOS ABS: 0 {cells}/uL — AB (ref 15–500)
Eosinophils Relative: 0 %
HCT: 39.9 % (ref 35.0–45.0)
HEMOGLOBIN: 13.2 g/dL (ref 11.7–15.5)
LYMPHS ABS: 2343 {cells}/uL (ref 850–3900)
Lymphocytes Relative: 33 %
MCH: 31.7 pg (ref 27.0–33.0)
MCHC: 33.1 g/dL (ref 32.0–36.0)
MCV: 95.7 fL (ref 80.0–100.0)
MPV: 9 fL (ref 7.5–12.5)
Monocytes Absolute: 639 cells/uL (ref 200–950)
Monocytes Relative: 9 %
NEUTROS ABS: 4047 {cells}/uL (ref 1500–7800)
Neutrophils Relative %: 57 %
PLATELETS: 220 10*3/uL (ref 140–400)
RBC: 4.17 MIL/uL (ref 3.80–5.10)
RDW: 13.2 % (ref 11.0–15.0)
WBC: 7.1 10*3/uL (ref 4.0–10.5)

## 2016-07-18 LAB — SEDIMENTATION RATE: Sed Rate: 16 mm/hr (ref 0–20)

## 2016-07-18 NOTE — Progress Notes (Signed)
Subjective: Chief Complaint  Patient presents with  . tingle and numbness in rt arm and    finger is turning blue, tingling, numbness    Here for several new symptoms.   She was in usual state of health until about 2 weeks ago.  She is active, exercises, eats healthy.   She has hx/o heart murmur as a child.  2 weeks ago started having some electric shock type sensations in right lower body.   This has continued.  However, last evening right ring finger seemed purplish/red and felt dull sensation.  Its has gotten more red and less purple in the past several hours but still feels dull sensation.   Right eye is twitching, right upper eyelid feels a little heavy or puffy.  She notes some recent stuttering and possibly slurred speech.  Denies headache.  There is some achy with the finger but not frank pain.     She denies facial numbness or weakness, although right extremity feels somewhat off, not specifically weak.  No confusion, no vision or hearing changes, no chest pain, no edema, no palpations.   She is a former smoker, drinks maybe 4 alcohol drinks per week, no drugs.     No hx/o DVT, PE, no hx/o heart disease.  She does have hx/o ectopic pregnancy, is currently has IUD.  She has hx/o raynaud's without other autoimmune issues.  No other aggravating or relieving factors. No other complaint.   Past Medical History:  Diagnosis Date  . Allergy    RHINITIS  . Anemia   . Blood transfusion   . Ectopic pregnancy 03/2011   ruptured just prior to having abortion  . Heart murmur   . Prediabetes   . Seizure disorder (HCC)    5th grade  . Vitamin D deficiency    Current Outpatient Prescriptions on File Prior to Visit  Medication Sig Dispense Refill  . cholecalciferol (VITAMIN D) 1000 UNITS tablet Take 1,000 Units by mouth daily.    . Levonorgestrel (SKYLA) 13.5 MG IUD by Intrauterine route.     No current facility-administered medications on file prior to visit.    Family History  Problem  Relation Age of Onset  . Heart disease Mother   . Stroke Father   . Mental illness Maternal Aunt   . Heart disease Maternal Uncle   . Diabetes Maternal Grandmother   . Hypertension Maternal Grandmother   . Arthritis Paternal Grandmother   . Asthma Paternal Grandmother   . Birth defects Paternal Grandmother   . Hypertension Paternal Grandmother   . Cancer Paternal Grandfather     bladder  . Heart disease Paternal Grandfather   . Hypertension Paternal Grandfather   . Stroke Paternal Grandfather   . Cancer Cousin     breast   Review of Systems Constitutional: -fever, -chills, -sweats, -unexpected weight change,-fatigue ENT: -runny nose, -ear pain, -sore throat Cardiology:  -chest pain, -palpitations, -edema Respiratory: -cough, -shortness of breath, -wheezing Gastroenterology: -abdominal pain, -nausea, -vomiting, -diarrhea, -constipation Musculoskeletal: -arthralgias, -myalgias, -joint swelling, -back pain Ophthalmology: -vision changes Urology: -dysuria, -difficulty urinating, -hematuria, -urinary frequency, -urgency Neurology: -headache, +weakness, +tingling, -numbness    Objective: BP 104/60   Pulse (!) 59   Wt 136 lb (61.7 kg)   SpO2 96%   BMI 22.63 kg/m   General appearance: alert, no distress, WD/WN, lean white female HEENT: normocephalic, sclerae anicteric, PERRLA, EOMi, nares patent, no discharge or erythema, pharynx normal Oral cavity: MMM, no lesions Neck: supple, no lymphadenopathy, no thyromegaly,  no masses, no bruits Heart: RRR, normal S1, S2, no murmurs Lungs: CTA bilaterally, no wheezes, rhonchi, or rales Musculoskeletal: nontender, no swelling, no obvious deformity Extremities: there is faint purplish/red coloration of right 4th finger but normal sensation normal cap refill, nontender.   otherwise no edema, no cyanosis, no clubbing Pulses: 2+ symmetric, upper and lower extremities, normal cap refill Neurological: alert, oriented x 3, CN2-12 intact,  strength normal upper extremities and lower extremities, sensation normal throughout, DTRs 2+ throughout, no cerebellar signs, gait normal, no focal deficits Psychiatric: normal affect, behavior normal, pleasant     Adult ECG Report  Indication: paresthesias  Rate: 50 bpm  Rhythm: sinus bradycardia  QRS Axis: 87 degrees  PR Interval:  QRS Duration: 74ms  QTc:  Conduction Disturbances: none  Other Abnormalities: none  Patient's cardiac risk factors are: family history of premature cardiovascular disease.  EKG comparison: none  Narrative Interpretation: sinus bradycardia   Assessment: Encounter Diagnoses  Name Primary?  . Discoloration of skin of finger Yes  . Slurred speech   . Paresthesias   . Raynaud's disease without gangrene     Plan: Called and spoke to Dr. Lynelle Doctor supervising physician about case and recommendations.  differential includes venous thrombosis, TIA/CVA, thoracic outlet syndrome, autoimmune disease, clotting disorder, electrolyte disturbance, MS and other possibilities.  Other than finger finding, exam is normal.  Discussed her symptoms, findings, history.  Advised patient that differential is quite large.  We will check labs today.  EKG reviewed.  Finger pulse ox was 96% of affected finger.    Advised if any signs /assumptions of TIA/CVA as discussed over weekend to call 911.   Otherwise we will call with lab results and next steps.  Begin Aspirin  daily starting tonight.  Monte was seen today for tingle and numbness in rt arm and.  Diagnoses and all orders for this visit:  Discoloration of skin of finger -     CBC with Differential/Platelet -     Basic metabolic panel -     TSH -     Sedimentation rate -     ANA  Slurred speech -     CBC with Differential/Platelet -     Basic metabolic panel -     TSH -     Sedimentation rate -     ANA  Paresthesias -     CBC with Differential/Platelet -     Basic metabolic panel -      TSH -     Sedimentation rate -     ANA  Raynaud's disease without gangrene -     CBC with Differential/Platelet -     Basic metabolic panel -     TSH -     Sedimentation rate -     ANA

## 2016-07-18 NOTE — Patient Instructions (Signed)
Begin Aspirin  daily If any symptoms of stroke as below, call 911 We will call with results   Ischemic Stroke An ischemic stroke (cerebrovascular accident, or CVA) is the sudden death of brain tissue that occurs when an area of the brain does not get enough oxygen. It is a medical emergency that must be treated right away. An ischemic stroke can cause permanent loss of brain function. This can cause problems with how different parts of your body function. What are the causes? This condition is caused by a decrease of oxygen supply to an area of the brain, which may be the result of:  A small blood clot (embolus) or a buildup of plaque in the blood vessels (atherosclerosis) that blocks blood flow in the brain.  An abnormal heart rhythm (atrial fibrillation).  A blocked or damaged artery in the head or neck. What increases the risk? Certain factors may make you more likely to develop this condition. Some of these factors are things that you can change, such as:  Obesity.  Smoking cigarettes.  Taking oral birth control, especially if you also use tobacco.  Physical inactivity.  Excessive alcohol use.  Use of illegal drugs, especially cocaine and methamphetamine. Other risk factors include:  High blood pressure (hypertension).  High cholesterol.  Diabetes mellitus.  Heart disease.  Being [place] American, Native [address], Hispanic, or [place] Native.  Being over age 58.  Family history of stroke.  Previous history of blood clots, stroke, or transient ischemic attack (TIA).  Sickle cell disease.  Being a woman with a history of preeclampsia.  Migraine headache.  Sleep apnea.  Irregular heartbeats, such as atrial fibrillation.  Chronic inflammatory diseases, such as rheumatoid arthritis or lupus.  Blood clotting disorders (hypercoagulable state). What are the signs or symptoms? Symptoms of this condition usually develop suddenly, or you may notice them after  waking up from sleep. Symptoms may include sudden:  Weakness or numbness in your face, arm, or leg, especially on one side of your body.  Trouble walking or difficulty moving your arms or legs.  Loss of balance or coordination.  Confusion.  Slurred speech (dysarthria).  Trouble speaking, understanding speech, or both (aphasia).  Vision changes-such as double vision, blurred vision, or loss of vision-inone or both eyes.  Dizziness.  Nausea and vomiting.  Severe headache with no known cause. The headache is often described as the worst headache ever experienced. If possible, make note of the exact time that you last felt like your normal self and what time your symptoms started. Tell your health care provider. If symptoms come and go, this could be a sign of a warning stroke, or TIA. Get help right away, even if you feel better. How is this diagnosed? This condition may be diagnosed based on:  Your symptoms, your medical history, and a physical exam.  CT scan of the brain.  MRI.  CT angiogram. This test uses a computer to take X-rays of your arteries. A dye may be injected into your blood to show the inside of your blood vessels more clearly.  MRI angiogram. This is a type of MRI that is used to evaluate the blood vessels.  Cerebral angiogram. This test uses X-rays and a dye to show the blood vessels in the brain and neck. You may need to see a health care provider who specializes in stroke care. A stroke specialist can be seen in person or through communication using telephone or television technology (telemedicine). Other tests may also be  done to find the cause of the stroke, such as:  Electrocardiogram (ECG).  Continuous heart monitoring.  Echocardiogram.  Carotid ultrasound.  A scan of the brain circulation.  Blood tests.  Sleep study to check for sleep apnea. How is this treated? Treatment for this condition will depend on the duration, severity, and cause  of your symptoms and on the area of the brain affected. It is very important to get treatment at the first sign of stroke symptoms. Some treatments work better if they are done within 3-6 hours of the onset of stroke symptoms. These initial treatments may include:  Aspirin.  Medicines to control blood pressure.  Medicine given by injection to dissolve the blood clot (thrombolytic).  Treatments given directly to the affected artery to remove or dissolve the blood clot. Other treatment options may include:  Oxygen.  IV fluids.  Medicines to thin the blood (anticoagulants or antiplatelets).  Procedures to increase blood flow. Medicines and changes to your diet may be used to help treat and manage risk factors for stroke, such as diabetes, high cholesterol, and high blood pressure. After a stroke, you may work with physical, speech, mental health, or occupational therapists to help you recover. Follow these instructions at home: Medicines   Take over-the-counter and prescription medicines only as told by your health care provider.  If you were told to take a medicine to thin your blood, such as aspirin or an anticoagulant, take it exactly as told by your health care provider.  Taking too much blood-thinning medicine can cause bleeding.  If you do not take enough blood-thinning medicine, you will not have the protection that you need against another stroke and other problems.  Understand the side effects of taking anticoagulant medicine. When taking this type of medicine, make sure you:  Hold pressure over any cuts for longer than usual.  Tell your dentist and other health care providers that you are taking anticoagulants before you have any procedures that may cause bleeding.  Avoid activities that may cause trauma or injury. Eating and drinking   Follow instructions from your health care provider about diet.  Eat healthy foods.  If your ability to swallow was affected by the  stroke, you may need to take steps to avoid choking, such as:  Taking small bites when eating.  Eating foods that are soft or pureed. Safety   Follow instructions from your health care team about physical activity.  Use a walker or cane as told by your health care provider.  Take steps to create a safe home environment in order to reduce the risk of falls. This may include:  Having your home looked at by specialists.  Installing grab bars in the bedroom and bathroom.  Using safety equipment, such as raised toilets and a seat in the shower. General instructions   Do not use any tobacco products, such as cigarettes, chewing tobacco, and e-cigarettes. If you need help quitting, ask your health care provider.  Limit alcohol intake to no more than 1 drink a day for nonpregnant women and 2 drinks a day for men. One drink equals 12 oz of beer, 5 oz of wine, or 1 oz of hard liquor.  If you need help to stop using drugs or alcohol, ask your health care provider about a referral to a program or specialist.  Maintain an active and healthy lifestyle. Get regular exercise as told by your health care provider.  Keep all follow-up visits as told by your health  care provider, including visits with all specialists on your health care team. This is important. How is this prevented? Your risk of another stroke can be decreased by managing high blood pressure, high cholesterol, diabetes, heart disease, sleep apnea, and obesity. It can also be decreased by quitting smoking, limiting alcohol, and staying physically active. Your health care provider will continue to work with you on measures to prevent short-term and long-term complications of stroke. Get help right away if: You have:  Sudden weakness or numbness in your face, arm, or leg, especially on one side of your body.  Sudden confusion.  Sudden trouble speaking, understanding, or both (aphasia).  Sudden trouble seeing with one or both  eyes.  Sudden trouble walking or difficulty moving your arms or legs.  Sudden dizziness.  Sudden loss of balance or coordination.  Sudden, severe headache with no known cause.  A partial or total loss of consciousness.  A seizure. Any of these symptoms may represent a serious problem that is an emergency. Do not wait to see if the symptoms will go away. Get medical help right away. Call your local emergency services (911 in [place] Do not drive yourself to the hospital. This information is not intended to replace advice given to you by your health care provider. Make sure you discuss any questions you have with your health care provider. Document Released: 03/10/2005 Document Revised: 08/21/2015 Document Reviewed: 06/06/2015 Elsevier Interactive Patient Education  2017 [employer].

## 2016-07-18 NOTE — Progress Notes (Signed)
Complete Physical  Assessment and Plan:  Prediabetes - Hemoglobin A1c - Insulin, fasting   Vitamin D deficiency - VITAMIN D 25 Hydroxy (Vit-D Deficiency, Fractures)   Anemia, unspecified anemia type - monitor, continue iron supp with Vitamin C and increase green leafy veggies   Medication management - CBC with Differential/Platelet - BASIC METABOLIC PANEL WITH GFR - Hepatic function panel - TSH - Magnesium  Screening cholesterol level - Lipid panel  Screening for blood or protein in urine - Urinalysis, Routine w reflex microscopic (not at [facility]) - Microalbumin / creatinine urine ratio   Routine general medical examination at a health care facility  Medication management -     CBC with Differential/Platelet -     BASIC METABOLIC PANEL WITH GFR -     Hepatic function panel -     Magnesium  Screening for blood or protein in urine -     Urinalysis, Routine w reflex microscopic -     Microalbumin / creatinine urine ratio  Raynaud's phenomenon (secondary) ? Single digit raynaud's Patient advised not to smoke Check vasculitis labs, still pending ANA Go to ER if any changes in vision, new rash -     TSH -     Anti-DNA antibody, double-stranded -     RNP Antibody -     Centromere Antibodies; Future -     Anti-scleroderma antibody -     ANCA screen with reflex titer -     C3 and C4 -     Complement, total   Discussed med's effects and SE's. Screening labs and tests as requested with regular follow-up as recommended. Over 40 minutes of exam, counseling, chart review, and critical decision making was performed this visit.   HPI  36 y.o. female  presents for a complete physical.  Her blood pressure has been controlled at home, today their BP is BP: 122/80 She does workout. She denies chest pain, shortness of breath, dizziness.  Patient went to UC over the weekend for purplish/red color of right 4th digit with dull ache/sensation, right eye twitching, had normal ESR,  EKG, CBC, kidney function slightly elevated. She has had right eye twitching, feeling electric sensations through bottom right leg and bottom leg, right arm some numbness. No HA, dizziness, changes in vision. Had menorrhagia in the past.  She is not on cholesterol medication and denies myalgias. Her cholesterol is at goal. The cholesterol last visit was:   Lab Results  Component Value Date   CHOL 205 (H) 07/18/2015   HDL 68 07/18/2015   LDLCALC 117 07/18/2015   TRIG 98 07/18/2015   CHOLHDL 3.0 07/18/2015  She has had elevated insulin in the past. Most recent A1C in the office was:  Lab Results  Component Value Date   HGBA1C 5.2 07/18/2015  Patient is on Vitamin D supplement, 10000 IU a day.   Lab Results  Component Value Date   VD25OH 55 07/18/2015  She has a history of ectopic pregnancy 2013 s/p fallopion tube removal, with UID, follows with Dr. Edwyna Ready.  Wt Readings from Last 3 Encounters:  07/21/16 136 lb 12.8 oz (62.1 kg)  07/18/16 136 lb (61.7 kg)  07/18/15 133 lb 3.2 oz (60.4 kg)      Works in Personal assistant.  Current Medications:  Current Outpatient Prescriptions on File Prior to Visit  Medication Sig Dispense Refill  . APAP-Pamabrom-Pyrilamine (FP MULTI-SYMPTOM PMS PO) Take by mouth.    . cholecalciferol (VITAMIN D) 1000 UNITS tablet Take 1,000 Units  by mouth daily.    . Levonorgestrel (SKYLA) 13.5 MG IUD by Intrauterine route.     No current facility-administered medications on file prior to visit.    Health Maintenance:   Immunization History  Administered Date(s) Administered  . Tdap 06/28/2012   Tetanus: 2014 Pneumovax: N/A Flu vaccine: N/A Zostavax: N/A  Pap: 07/14/2014 MGM:  Feb 2016 normal, dense breast   DEXA: N/A Colonoscopy: N/A EGD: N/A CT AB 2013 Negative Hep panel 06/2011  Patient Care Team: Lucky Cowboy, MD as PCP - General (Internal Medicine)  Medical History:  Past Medical History:  Diagnosis Date  . Allergy    RHINITIS  . Anemia   .  Blood transfusion   . Ectopic pregnancy 03/2011   ruptured just prior to having abortion  . Heart murmur   . Prediabetes   . Seizure disorder (HCC)    5th grade  . Vitamin D deficiency    Allergies Allergies  Allergen Reactions  . Penicillins Hives    SURGICAL HISTORY She  has a past surgical history that includes Dilation and curettage of uterus (03/2011) and laparotomy (04/17/2011). FAMILY HISTORY Her family history includes Arthritis in her paternal grandmother; Asthma in her paternal grandmother; Birth defects in her paternal grandmother; Cancer in her cousin and paternal grandfather; Diabetes in her maternal grandmother; Heart disease in her maternal uncle, mother, and paternal grandfather; Hypertension in her maternal grandmother, paternal grandfather, and paternal grandmother; Mental illness in her maternal aunt; Stroke in her father and paternal grandfather. SOCIAL HISTORY She  reports that she quit smoking about 5 years ago. Her smoking use included E-cigarettes. She has never used smokeless tobacco. She reports that she drinks alcohol. She reports that she does not use drugs.  Review of Systems: Review of Systems  Constitutional: Positive for malaise/fatigue. Negative for chills, diaphoresis, fever and weight loss.  HENT: Negative.   Eyes: Negative.   Respiratory: Negative.   Cardiovascular: Negative.   Gastrointestinal: Negative for abdominal pain, blood in stool, constipation, diarrhea, heartburn, melena, nausea and vomiting.  Genitourinary: Negative.   Musculoskeletal: Negative.   Skin: Negative.   Neurological: Negative.  Negative for weakness.  Endo/Heme/Allergies: Negative.   Psychiatric/Behavioral: Negative for depression, hallucinations, memory loss, substance abuse and suicidal ideas. The patient is nervous/anxious. The patient does not have insomnia.     Physical Exam: Estimated body mass index is 22.76 kg/m as calculated from the following:   Height as of  this encounter: 5\' 5"  (1.651 m).   Weight as of this encounter: 136 lb 12.8 oz (62.1 kg). BP 122/80   Pulse 73   Temp 98.1 F (36.7 C)   Resp 16   Ht 5\' 5"  (1.651 m)   Wt 136 lb 12.8 oz (62.1 kg)   LMP 07/17/2016   SpO2 96%   BMI 22.76 kg/m  General Appearance: Well nourished, in no apparent distress.  Eyes: PERRLA, EOMs, conjunctiva no swelling or erythema, normal fundi and vessels.  Sinuses: No Frontal/maxillary tenderness  ENT/Mouth: Ext aud canals clear, normal light reflex with TMs without erythema, bulging. Good dentition. No erythema, swelling, or exudate on post pharynx. Tonsils not swollen or erythematous. Hearing normal.  Neck: Supple, thyroid normal. No bruits  Respiratory: Respiratory effort normal, BS equal bilaterally without rales, rhonchi, wheezing or stridor.  Cardio: RRR without murmurs, rubs or gallops. Brisk peripheral pulses without edema.  Chest: symmetric, with normal excursions and percussion.  Breasts: defer Abdomen: Soft, slight lower AB tenderness, no guarding, rebound, hernias, masses, or  organomegaly.  Lymphatics: Non tender without lymphadenopathy.  Genitourinary: defer Musculoskeletal: Full ROM all peripheral extremities,5/5 strength, and normal gait.  Skin: Warm, dry without rashes, lesions, ecchymosis. Neuro: Cranial nerves intact, reflexes equal bilaterally. Normal muscle tone, no cerebellar symptoms. Sensation intact.  Psych: Awake and oriented X 3, normal affect, Insight and Judgment appropriate.   EKG: declines AORTA SCAN: declines  [name] 9:24 AM [facility]

## 2016-07-19 LAB — TSH: TSH: 1.07 mIU/L

## 2016-07-21 ENCOUNTER — Encounter: Payer: Self-pay | Admitting: Physician Assistant

## 2016-07-21 ENCOUNTER — Encounter: Payer: Self-pay | Admitting: Internal Medicine

## 2016-07-21 ENCOUNTER — Ambulatory Visit (INDEPENDENT_AMBULATORY_CARE_PROVIDER_SITE_OTHER): Payer: Managed Care, Other (non HMO) | Admitting: Physician Assistant

## 2016-07-21 ENCOUNTER — Other Ambulatory Visit: Payer: Self-pay | Admitting: Internal Medicine

## 2016-07-21 VITALS — BP 122/80 | HR 73 | Temp 98.1°F | Resp 16 | Ht 65.0 in | Wt 136.8 lb

## 2016-07-21 DIAGNOSIS — D649 Anemia, unspecified: Secondary | ICD-10-CM

## 2016-07-21 DIAGNOSIS — R6889 Other general symptoms and signs: Secondary | ICD-10-CM | POA: Diagnosis not present

## 2016-07-21 DIAGNOSIS — Z0001 Encounter for general adult medical examination with abnormal findings: Secondary | ICD-10-CM | POA: Diagnosis not present

## 2016-07-21 DIAGNOSIS — Z0000 Encounter for general adult medical examination without abnormal findings: Secondary | ICD-10-CM

## 2016-07-21 DIAGNOSIS — I7300 Raynaud's syndrome without gangrene: Secondary | ICD-10-CM

## 2016-07-21 DIAGNOSIS — E559 Vitamin D deficiency, unspecified: Secondary | ICD-10-CM

## 2016-07-21 DIAGNOSIS — Z79899 Other long term (current) drug therapy: Secondary | ICD-10-CM | POA: Diagnosis not present

## 2016-07-21 DIAGNOSIS — R7303 Prediabetes: Secondary | ICD-10-CM | POA: Diagnosis not present

## 2016-07-21 DIAGNOSIS — Z13220 Encounter for screening for lipoid disorders: Secondary | ICD-10-CM

## 2016-07-21 DIAGNOSIS — L739 Follicular disorder, unspecified: Secondary | ICD-10-CM

## 2016-07-21 DIAGNOSIS — Z1389 Encounter for screening for other disorder: Secondary | ICD-10-CM

## 2016-07-21 LAB — BASIC METABOLIC PANEL WITH GFR
BUN: 16 mg/dL (ref 7–25)
CALCIUM: 9.2 mg/dL (ref 8.6–10.2)
CO2: 23 mmol/L (ref 20–31)
CREATININE: 0.9 mg/dL (ref 0.50–1.10)
Chloride: 104 mmol/L (ref 98–110)
GFR, Est Non African American: 82 mL/min (ref >=60)
Glucose, Bld: 80 mg/dL (ref 65–99)
Potassium: 4.7 mmol/L (ref 3.5–5.3)
SODIUM: 141 mmol/L (ref 135–146)

## 2016-07-21 LAB — IRON AND TIBC
%SAT: 48 % (ref 11–50)
Iron: 160 ug/dL (ref 40–190)
TIBC: 334 ug/dL (ref 250–450)
UIBC: 174 ug/dL (ref 125–400)

## 2016-07-21 LAB — LIPID PANEL
CHOL/HDL RATIO: 3.1 ratio (ref <5.0)
CHOLESTEROL: 167 mg/dL (ref <200)
HDL: 54 mg/dL (ref >50)
LDL Cholesterol: 93 mg/dL (ref <100)
Triglycerides: 99 mg/dL (ref <150)
VLDL: 20 mg/dL (ref <30)

## 2016-07-21 LAB — HEPATIC FUNCTION PANEL
ALT: 14 U/L (ref 6–29)
AST: 22 U/L (ref 10–30)
Albumin: 4.2 g/dL (ref 3.6–5.1)
Alkaline Phosphatase: 34 U/L (ref 33–115)
BILIRUBIN DIRECT: 0.1 mg/dL (ref <=0.2)
Indirect Bilirubin: 0.3 mg/dL (ref 0.2–1.2)
Total Bilirubin: 0.4 mg/dL (ref 0.2–1.2)
Total Protein: 7.3 g/dL (ref 6.1–8.1)

## 2016-07-21 LAB — TSH: TSH: 1.09 m[IU]/L

## 2016-07-21 LAB — FERRITIN: Ferritin: 39 ng/mL (ref 10–154)

## 2016-07-21 LAB — ANA: ANA: NEGATIVE

## 2016-07-21 LAB — VITAMIN B12: Vitamin B-12: 1259 pg/mL — ABNORMAL HIGH (ref 200–1100)

## 2016-07-21 NOTE — Patient Instructions (Signed)
Raynaud Phenomenon Raynaud phenomenon is a condition that affects the blood vessels (arteries) that carry blood to your fingers and toes. The arteries that supply blood to your ears or the tip of your nose might also be affected. Raynaud phenomenon causes the arteries to temporarily narrow. As a result, the flow of blood to the affected areas is temporarily decreased. This usually occurs in response to cold temperatures or stress. During an attack, the skin in the affected areas turns white. You may also feel tingling or numbness in those areas. Attacks usually last for only a brief period, and then the blood flow to the area returns to normal. In most cases, Raynaud phenomenon does not cause serious health problems. What are the causes? For many people with this condition, the cause is not known. Raynaud phenomenon is sometimes associated with other diseases, such as scleroderma or lupus. What increases the risk? Raynaud phenomenon can affect anyone, but it develops most often in people who are 16-47 years old. It affects more females than males. What are the signs or symptoms? Symptoms of Raynaud phenomenon may occur when you are exposed to cold temperatures or when you have emotional stress. The symptoms may last for a few minutes or up to several hours. They usually affect your fingers but may also affect your toes, ears, or the tip of your nose. Symptoms may include:  Changes in skin color. The skin in the affected areas will turn pale or white. The skin may then change from white to bluish to red as normal blood flow returns to the area.  Numbness, tingling, or pain in the affected areas. In severe cases, sores may develop in the affected areas. How is this diagnosed? Your health care provider will do a physical exam and take your medical history. You may be asked to put your hands in cold water to check for a reaction to cold temperature. Blood tests may be done to check for other diseases or  conditions. Your health care provider may also order a test to check the movement of blood through your arteries and veins (vascular ultrasound). How is this treated? Treatment often involves making lifestyle changes and taking steps to control your exposure to cold temperatures. For more severe cases, medicine (calcium channel blockers) may be used to improve blood flow. Surgery is sometimes done to block the nerves that control the affected arteries, but this is rare. Follow these instructions at home:  Avoid exposure to cold by taking these steps:  If possible, stay indoors during cold weather.  When you go outside during cold weather, dress in layers and wear mittens, a hat, a scarf, and warm footwear.  Wear mittens or gloves when handling ice or frozen food.  Use holders for glasses or cans containing cold drinks.  Let warm water run for a while before taking a shower or bath.  Warm up the car before driving in cold weather.  If possible, avoid stressful and emotional situations. Exercise, meditation, and yoga may help you cope with stress. Biofeedback may be useful.  Do not use any tobacco products, including cigarettes, chewing tobacco, or electronic cigarettes. If you need help quitting, ask your health care provider.  Avoid secondhand smoke.  Limit your use of caffeine. Switch to decaffeinated coffee, tea, and soda. Avoid chocolate.  Wear loose fitting socks and comfortable, roomy shoes.  Avoid vibrating tools and machinery.  Take medicines only as directed by your health care provider. Contact a health care provider if:  Your discomfort becomes worse despite lifestyle changes.  You develop sores on your fingers or toes that do not heal.  Your fingers or toes turn black.  You have breaks in the skin on your fingers or toes.  You have a fever.  You have pain or swelling in your joints.  You have a rash.  Your symptoms occur on only one side of your body. This  information is not intended to replace advice given to you by your health care provider. Make sure you discuss any questions you have with your health care provider. Document Released: 03/07/2000 Document Revised: 08/16/2015 Document Reviewed: 09/12/2015 Elsevier Interactive Patient Education  2017 [employer]vier Inc[employer]   if worsening HA, changes vision/speech, imbalance, weakness go to the ER

## 2016-07-22 LAB — URINALYSIS, ROUTINE W REFLEX MICROSCOPIC
Bilirubin Urine: NEGATIVE
Glucose, UA: NEGATIVE
HGB URINE DIPSTICK: NEGATIVE
KETONES UR: NEGATIVE
Leukocytes, UA: NEGATIVE
NITRITE: NEGATIVE
Protein, ur: NEGATIVE
SPECIFIC GRAVITY, URINE: 1.01 (ref 1.001–1.035)
pH: 7.5 (ref 5.0–8.0)

## 2016-07-22 LAB — CBC WITH DIFFERENTIAL/PLATELET
BASOS ABS: 41 {cells}/uL (ref 0–200)
Basophils Relative: 1 %
Eosinophils Absolute: 41 cells/uL (ref 15–500)
Eosinophils Relative: 1 %
HCT: 40.6 % (ref 35.0–45.0)
Hemoglobin: 13.5 g/dL (ref 11.7–15.5)
LYMPHS PCT: 34 %
Lymphs Abs: 1394 cells/uL (ref 850–3900)
MCH: 31.7 pg (ref 27.0–33.0)
MCHC: 33.3 g/dL (ref 32.0–36.0)
MCV: 95.3 fL (ref 80.0–100.0)
MONOS PCT: 12 %
MPV: 9.2 fL (ref 7.5–12.5)
Monocytes Absolute: 492 cells/uL (ref 200–950)
NEUTROS PCT: 52 %
Neutro Abs: 2132 cells/uL (ref 1500–7800)
PLATELETS: 233 10*3/uL (ref 140–400)
RBC: 4.26 MIL/uL (ref 3.80–5.10)
RDW: 13.2 % (ref 11.0–15.0)
WBC: 4.1 10*3/uL (ref 3.8–10.8)

## 2016-07-22 LAB — RNP ANTIBODY: RIBONUCLEIC PROTEIN(ENA) ANTIBODY, IGG: NEGATIVE

## 2016-07-22 LAB — C3 AND C4
C3 Complement: 94 mg/dL (ref 83–193)
C4 Complement: 14 mg/dL — ABNORMAL LOW (ref 15–57)

## 2016-07-22 LAB — VITAMIN D 25 HYDROXY (VIT D DEFICIENCY, FRACTURES): Vit D, 25-Hydroxy: 42 ng/mL (ref 30–100)

## 2016-07-22 LAB — HEMOGLOBIN A1C
Hgb A1c MFr Bld: 5.2 % (ref <5.7)
Mean Plasma Glucose: 103 mg/dL

## 2016-07-22 LAB — ANCA SCREEN W REFLEX TITER: ANCA Screen: NEGATIVE

## 2016-07-22 LAB — MICROALBUMIN / CREATININE URINE RATIO: CREATININE, URINE: 33 mg/dL (ref 20–320)

## 2016-07-22 LAB — MAGNESIUM: MAGNESIUM: 2.1 mg/dL (ref 1.5–2.5)

## 2016-07-22 LAB — ANTI-DNA ANTIBODY, DOUBLE-STRANDED: DS DNA AB: 1 [IU]/mL

## 2016-07-22 LAB — ANTI-SCLERODERMA ANTIBODY: SCLERODERMA (SCL-70) (ENA) ANTIBODY, IGG: NEGATIVE

## 2016-07-23 LAB — COMPLEMENT, TOTAL: Compl, Total (CH50): 50 U/mL (ref 31–60)

## 2016-10-30 NOTE — Progress Notes (Deleted)
Complete Physical  Assessment and Plan:  Prediabetes - Hemoglobin A1c - Insulin, fasting   Vitamin D deficiency - VITAMIN D 25 Hydroxy (Vit-D Deficiency, Fractures)   Anemia, unspecified anemia type - monitor, continue iron supp with Vitamin C and increase green leafy veggies   Medication management - CBC with Differential/Platelet - BASIC METABOLIC PANEL WITH GFR - Hepatic function panel - TSH - Magnesium  Screening cholesterol level - Lipid panel  Screening for blood or protein in urine - Urinalysis, Routine w reflex microscopic (not at [facility]) - Microalbumin / creatinine urine ratio   Routine general medical examination at a health care facility  Medication management -     CBC with Differential/Platelet -     BASIC METABOLIC PANEL WITH GFR -     Hepatic function panel -     Magnesium  Screening for blood or protein in urine -     Urinalysis, Routine w reflex microscopic -     Microalbumin / creatinine urine ratio  Raynaud's phenomenon (secondary) ? Single digit raynaud's Patient advised not to smoke Check vasculitis labs, still pending ANA Go to ER if any changes in vision, new rash -     TSH -     Anti-DNA antibody, double-stranded -     RNP Antibody -     Centromere Antibodies; Future -     Anti-scleroderma antibody -     ANCA screen with reflex titer -     C3 and C4 -     Complement, total   Discussed med's effects and SE's. Screening labs and tests as requested with regular follow-up as recommended. Over 40 minutes of exam, counseling, chart review, and critical decision making was performed this visit.   HPI  36 y.o. female  presents for a complete physical.  Her blood pressure has been controlled at home, today their BP is   She does workout. She denies chest pain, shortness of breath, dizziness.  Patient went to UC over the weekend for purplish/red color of right 4th digit with dull ache/sensation, right eye twitching, had normal ESR, EKG, CBC,  kidney function slightly elevated. She has had right eye twitching, feeling electric sensations through bottom right leg and bottom leg, right arm some numbness. No HA, dizziness, changes in vision. Had menorrhagia in the past.    Current Medications:  Current Outpatient Prescriptions on File Prior to Visit  Medication Sig Dispense Refill  . APAP-Pamabrom-Pyrilamine (FP MULTI-SYMPTOM PMS PO) Take by mouth.    . cholecalciferol (VITAMIN D) 1000 UNITS tablet Take 1,000 Units by mouth daily.    . DHA-EPA-Vitamin E (OMEGA-3 COMPLEX PO) Take 300 mg by mouth 2 (two) times daily.    . Levonorgestrel (SKYLA) 13.5 MG IUD by Intrauterine route.     No current facility-administered medications on file prior to visit.    Medical History:  Past Medical History:  Diagnosis Date  . Allergy    RHINITIS  . Anemia   . Blood transfusion   . Ectopic pregnancy 03/2011   ruptured just prior to having abortion  . Heart murmur   . Prediabetes   . Seizure disorder (HCC)    5th grade  . Vitamin D deficiency    Allergies Allergies  Allergen Reactions  . Penicillins Hives    Review of Systems: Review of Systems  Constitutional: Negative for chills, diaphoresis, fever, malaise/fatigue and weight loss.  HENT: Negative.   Eyes: Negative.   Respiratory: Negative.   Cardiovascular: Negative.   Gastrointestinal:  Negative for abdominal pain, blood in stool, constipation, diarrhea, heartburn, melena, nausea and vomiting.  Genitourinary: Negative.   Musculoskeletal: Negative.   Skin: Negative.   Neurological: Negative.  Negative for weakness.  Endo/Heme/Allergies: Negative.   Psychiatric/Behavioral: Negative for depression, hallucinations, memory loss, substance abuse and suicidal ideas. The patient is nervous/anxious. The patient does not have insomnia.     Physical Exam: Estimated body mass index is 22.76 kg/m as calculated from the following:   Height as of 07/21/16: '5\' 5"'$  (1.651 m).   Weight as of  07/21/16: 136 lb 12.8 oz (62.1 kg). There were no vitals taken for this visit. General Appearance: Well nourished, in no apparent distress.  Eyes: PERRLA, EOMs, conjunctiva no swelling or erythema, normal fundi and vessels.  Sinuses: No Frontal/maxillary tenderness  ENT/Mouth: Ext aud canals clear, normal light reflex with TMs without erythema, bulging. Good dentition. No erythema, swelling, or exudate on post pharynx. Tonsils not swollen or erythematous. Hearing normal.  Neck: Supple, thyroid normal. No bruits  Respiratory: Respiratory effort normal, BS equal bilaterally without rales, rhonchi, wheezing or stridor.  Cardio: RRR without murmurs, rubs or gallops. Brisk peripheral pulses without edema.  Chest: symmetric, with normal excursions and percussion.  Abdomen: Soft, slight lower AB tenderness, no guarding, rebound, hernias, masses, or organomegaly.  Lymphatics: Non tender without lymphadenopathy.  Musculoskeletal: Full ROM all peripheral extremities,5/5 strength, and normal gait.  Skin: Warm, dry without rashes, lesions, ecchymosis. Neuro: Cranial nerves intact, reflexes equal bilaterally. Normal muscle tone, no cerebellar symptoms. Sensation intact.  Psych: Awake and oriented X 3, normal affect, Insight and Judgment appropriate.    [name] 1:16 PM [facility]

## 2016-10-31 ENCOUNTER — Ambulatory Visit: Payer: Self-pay | Admitting: Physician Assistant

## 2017-07-20 NOTE — Progress Notes (Signed)
Complete Physical  Assessment and Plan:  Prediabetes - Hemoglobin A1c - Insulin, fasting   Vitamin D deficiency - VITAMIN D 25 Hydroxy (Vit-D Deficiency, Fractures)   Anemia, unspecified anemia type - monitor, continue iron supp with Vitamin C and increase green leafy veggies   Medication management - CBC with Differential/Platelet - BASIC METABOLIC PANEL WITH GFR - Hepatic function panel - TSH - Magnesium  Screening cholesterol level - Lipid panel  Screening for blood or protein in urine - Urinalysis, Routine w reflex microscopic (not at [facility]) - Microalbumin / creatinine urine ratio   Routine general medical examination at a health care facility 1 year  Medication management -     CBC with Differential/Platelet -     BASIC METABOLIC PANEL WITH GFR -     Hepatic function panel -     Magnesium  Screening for diabetes mellitus -     Hemoglobin A1c  Abnormal menses -     TSH -     Follicle stimulating hormone -     Estradiol   Discussed med's effects and SE's. Screening labs and tests as requested with regular follow-up as recommended. Over 40 minutes of exam, counseling, chart review, and critical decision making was performed this visit.   HPI  37 y.o. female  presents for a complete physical.  Her blood pressure has been controlled at home, today their BP is BP: 99/68 She does workout. She denies chest pain, shortness of breath, dizziness.  She has history of Raynauds, neg autoimmune work up.  She is not on cholesterol medication and denies myalgias. Her cholesterol is at goal. The cholesterol last visit was:   Lab Results  Component Value Date   CHOL 167 07/21/2016   HDL 54 07/21/2016   LDLCALC 93 07/21/2016   TRIG 99 07/21/2016   CHOLHDL 3.1 07/21/2016  She has had elevated insulin in the past. Most recent A1C in the office was:  Lab Results  Component Value Date   HGBA1C 5.2 07/21/2016  Patient is on Vitamin D supplement, 10000 IU a day.   Lab  Results  Component Value Date   VD25OH 42 07/21/2016  She has a history of ectopic pregnancy 2013 s/p fallopion tube removal, took out IUD, she is not on anything, follows with Dr. Rosemary Holms. She feels that she is not able to lose weight, that weight has redistributed to her thighs.  Wt Readings from Last 7 Encounters:  07/22/17 134 lb 3.2 oz (60.9 kg)  07/21/16 136 lb 12.8 oz (62.1 kg)  07/18/16 136 lb (61.7 kg)  07/18/15 133 lb 3.2 oz (60.4 kg)  11/17/14 131 lb (59.4 kg)  07/18/14 132 lb (59.9 kg)  06/29/13 127 lb (57.6 kg)   Works in [employer], [employer].  Current Medications:  Current Outpatient Medications on File Prior to Visit  Medication Sig Dispense Refill  . cholecalciferol (VITAMIN D) 1000 UNITS tablet Take 1,000 Units by mouth daily.    . vitamin B-12 (CYANOCOBALAMIN) 1000 MCG tablet Take 1,000 mcg by mouth daily.     No current facility-administered medications on file prior to visit.    Health Maintenance:   Immunization History  Administered Date(s) Administered  . Influenza-Unspecified 01/14/2017  . Tdap 06/28/2012   Tetanus: 2014 Pneumovax: N/A Flu vaccine: N/A Zostavax: N/A  Pap: 07/14/2014 states had with Dr. Billy Coast.  MGM:  Feb 2016 normal, dense breast   DEXA: N/A Colonoscopy: N/A EGD: N/A CT AB 2013 Negative Hep panel 06/2011  Patient Care Team:  Lucky Cowboy, MD as PCP - General (Internal Medicine)  Medical History:  Past Medical History:  Diagnosis Date  . Allergy    RHINITIS  . Anemia   . Blood transfusion   . Ectopic pregnancy 03/2011   ruptured just prior to having abortion  . Heart murmur   . Prediabetes   . Seizure disorder (HCC)    5th grade  . Vitamin D deficiency    Allergies Allergies  Allergen Reactions  . Penicillins Hives    SURGICAL HISTORY She  has a past surgical history that includes Dilation and curettage of uterus (03/2011) and laparotomy (04/17/2011). FAMILY HISTORY Her family history includes Arthritis in her paternal  grandmother; Asthma in her paternal grandmother; Birth defects in her paternal grandmother; Cancer in her cousin and paternal grandfather; Diabetes in her maternal grandmother; Heart disease in her maternal uncle, mother, and paternal grandfather; Hypertension in her maternal grandmother, paternal grandfather, and paternal grandmother; Mental illness in her maternal aunt; Stroke in her father and paternal grandfather. SOCIAL HISTORY She  reports that she quit smoking about 6 years ago. Her smoking use included e-cigarettes. She has never used smokeless tobacco. She reports that she drinks alcohol. She reports that she does not use drugs.  Review of Systems: Review of Systems  Constitutional: Positive for malaise/fatigue. Negative for chills, diaphoresis, fever and weight loss.  HENT: Negative.   Eyes: Negative.   Respiratory: Negative.   Cardiovascular: Negative.   Gastrointestinal: Negative for abdominal pain, blood in stool, constipation, diarrhea, heartburn, melena, nausea and vomiting.  Genitourinary: Negative.   Musculoskeletal: Negative.   Skin: Negative.   Neurological: Negative.  Negative for weakness.  Endo/Heme/Allergies: Negative.   Psychiatric/Behavioral: Negative for depression, hallucinations, memory loss, substance abuse and suicidal ideas. The patient is nervous/anxious. The patient does not have insomnia.     Physical Exam: Estimated body mass index is 22.68 kg/m as calculated from the following:   Height as of this encounter: 5' 4.5" (1.638 m).   Weight as of this encounter: 134 lb 3.2 oz (60.9 kg). BP 99/68   Pulse 67   Temp 97.7 F (36.5 C)   Resp 16   Ht 5' 4.5" (1.638 m)   Wt 134 lb 3.2 oz (60.9 kg)   LMP 06/26/2017   SpO2 98%   BMI 22.68 kg/m  General Appearance: Well nourished, in no apparent distress.  Eyes: PERRLA, EOMs, conjunctiva no swelling or erythema, normal fundi and vessels.  Sinuses: No Frontal/maxillary tenderness  ENT/Mouth: Ext aud canals  clear, normal light reflex with TMs without erythema, bulging. Good dentition. No erythema, swelling, or exudate on post pharynx. Tonsils not swollen or erythematous. Hearing normal.  Neck: Supple, thyroid normal. No bruits  Respiratory: Respiratory effort normal, BS equal bilaterally without rales, rhonchi, wheezing or stridor.  Cardio: RRR without murmurs, rubs or gallops. Brisk peripheral pulses without edema.  Chest: symmetric, with normal excursions and percussion.  Breasts: defer Abdomen: Soft, nontender, no guarding, rebound, hernias, masses, or organomegaly.  Lymphatics: Non tender without lymphadenopathy.  Genitourinary: defer Musculoskeletal: Full ROM all peripheral extremities,5/5 strength, and normal gait.  Skin: Warm, dry without rashes, lesions, ecchymosis. Neuro: Cranial nerves intact, reflexes equal bilaterally. Normal muscle tone, no cerebellar symptoms. Sensation intact.  Psych: Awake and oriented X 3, normal affect, Insight and Judgment appropriate.   EKG: declines AORTA SCAN: declines  Quentin Mulling 9:27 AM [facility]

## 2017-07-22 ENCOUNTER — Encounter: Payer: Self-pay | Admitting: Physician Assistant

## 2017-07-22 ENCOUNTER — Ambulatory Visit: Payer: Managed Care, Other (non HMO) | Admitting: Physician Assistant

## 2017-07-22 VITALS — BP 99/68 | HR 67 | Temp 97.7°F | Resp 16 | Ht 64.5 in | Wt 134.2 lb

## 2017-07-22 DIAGNOSIS — Z13220 Encounter for screening for lipoid disorders: Secondary | ICD-10-CM | POA: Diagnosis not present

## 2017-07-22 DIAGNOSIS — Z79899 Other long term (current) drug therapy: Secondary | ICD-10-CM

## 2017-07-22 DIAGNOSIS — Z131 Encounter for screening for diabetes mellitus: Secondary | ICD-10-CM | POA: Diagnosis not present

## 2017-07-22 DIAGNOSIS — Z1389 Encounter for screening for other disorder: Secondary | ICD-10-CM | POA: Diagnosis not present

## 2017-07-22 DIAGNOSIS — Z1329 Encounter for screening for other suspected endocrine disorder: Secondary | ICD-10-CM

## 2017-07-22 DIAGNOSIS — Z0000 Encounter for general adult medical examination without abnormal findings: Secondary | ICD-10-CM

## 2017-07-22 DIAGNOSIS — N926 Irregular menstruation, unspecified: Secondary | ICD-10-CM

## 2017-07-22 DIAGNOSIS — D649 Anemia, unspecified: Secondary | ICD-10-CM

## 2017-07-22 DIAGNOSIS — E559 Vitamin D deficiency, unspecified: Secondary | ICD-10-CM

## 2017-07-22 DIAGNOSIS — Z6822 Body mass index (BMI) 22.0-22.9, adult: Secondary | ICD-10-CM

## 2017-07-22 DIAGNOSIS — L739 Follicular disorder, unspecified: Secondary | ICD-10-CM

## 2017-07-22 LAB — CBC WITH DIFFERENTIAL/PLATELET
BASOS PCT: 1 %
Basophils Absolute: 51 cells/uL (ref 0–200)
EOS PCT: 0.8 %
Eosinophils Absolute: 41 cells/uL (ref 15–500)
HEMATOCRIT: 37.3 % (ref 35.0–45.0)
Hemoglobin: 13 g/dL (ref 11.7–15.5)
LYMPHS ABS: 1826 {cells}/uL (ref 850–3900)
MCH: 32.7 pg (ref 27.0–33.0)
MCHC: 34.9 g/dL (ref 32.0–36.0)
MCV: 94 fL (ref 80.0–100.0)
MPV: 9.9 fL (ref 7.5–12.5)
Monocytes Relative: 10.9 %
Neutro Abs: 2627 cells/uL (ref 1500–7800)
Neutrophils Relative %: 51.5 %
PLATELETS: 222 10*3/uL (ref 140–400)
RBC: 3.97 10*6/uL (ref 3.80–5.10)
RDW: 11.8 % (ref 11.0–15.0)
Total Lymphocyte: 35.8 %
WBC: 5.1 10*3/uL (ref 3.8–10.8)
WBCMIX: 556 {cells}/uL (ref 200–950)

## 2017-07-22 LAB — COMPLETE METABOLIC PANEL WITH GFR
AG RATIO: 1.8 (calc) (ref 1.0–2.5)
ALT: 14 U/L (ref 6–29)
AST: 20 U/L (ref 10–30)
Albumin: 4.6 g/dL (ref 3.6–5.1)
Alkaline phosphatase (APISO): 30 U/L — ABNORMAL LOW (ref 33–115)
BUN: 16 mg/dL (ref 7–25)
CALCIUM: 9.5 mg/dL (ref 8.6–10.2)
CO2: 28 mmol/L (ref 20–32)
CREATININE: 0.92 mg/dL (ref 0.50–1.10)
Chloride: 105 mmol/L (ref 98–110)
GFR, EST AFRICAN AMERICAN: 92 mL/min/{1.73_m2} (ref >=60)
GFR, EST NON AFRICAN AMERICAN: 79 mL/min/{1.73_m2} (ref >=60)
GLOBULIN: 2.5 g/dL (ref 1.9–3.7)
Glucose, Bld: 87 mg/dL (ref 65–99)
POTASSIUM: 4.6 mmol/L (ref 3.5–5.3)
Sodium: 139 mmol/L (ref 135–146)
TOTAL PROTEIN: 7.1 g/dL (ref 6.1–8.1)
Total Bilirubin: 0.5 mg/dL (ref 0.2–1.2)

## 2017-07-22 LAB — TSH: TSH: 1.13 mIU/L

## 2017-07-22 LAB — LIPID PANEL
CHOL/HDL RATIO: 3.2 (calc) (ref <5.0)
Cholesterol: 180 mg/dL (ref <200)
HDL: 57 mg/dL (ref >50)
LDL CHOLESTEROL (CALC): 103 mg/dL — AB
NON-HDL CHOLESTEROL (CALC): 123 mg/dL (ref <130)
Triglycerides: 100 mg/dL (ref <150)

## 2017-07-22 NOTE — Patient Instructions (Signed)
Food Choices for Gastroesophageal Reflux Disease, Adult When you have gastroesophageal reflux disease (GERD), the foods you eat and your eating habits are very important. Choosing the right foods can help ease your discomfort. What guidelines do I need to follow?  Choose fruits, vegetables, whole grains, and low-fat dairy products.  Choose low-fat meat, fish, and poultry.  Limit fats such as oils, salad dressings, butter, nuts, and avocado.  Keep a food diary. This helps you identify foods that cause symptoms.  Avoid foods that cause symptoms. These may be different for everyone.  Eat small meals often instead of 3 large meals a day.  Eat your meals slowly, in a place where you are relaxed.  Limit fried foods.  Cook foods using methods other than frying.  Avoid drinking alcohol.  Avoid drinking large amounts of liquids with your meals.  Avoid bending over or lying down until 2-3 hours after eating. What foods are not recommended? These are some foods and drinks that may make your symptoms worse: Vegetables  Tomatoes. Tomato juice. Tomato and spaghetti sauce. Chili peppers. Onion and garlic. Horseradish. Fruits  Oranges, grapefruit, and lemon (fruit and juice). Meats  High-fat meats, fish, and poultry. This includes hot dogs, ribs, ham, sausage, salami, and bacon. Dairy  Whole milk and chocolate milk. Sour cream. Cream. Butter. Ice cream. Cream cheese. Drinks  Coffee and tea. Bubbly (carbonated) drinks or energy drinks. Condiments  Hot sauce. Barbecue sauce. Sweets/Desserts  Chocolate and cocoa. Donuts. Peppermint and spearmint. Fats and Oils  High-fat foods. This includes French fries and potato chips. Other  Vinegar. Strong spices. This includes black pepper, white pepper, red pepper, cayenne, curry powder, cloves, ginger, and chili powder. The items listed above may not be a complete list of foods and drinks to avoid. Contact your dietitian for more information.    This information is not intended to replace advice given to you by your health care provider. Make sure you discuss any questions you have with your health care provider. Document Released: 09/09/2011 Document Revised: 08/16/2015 Document Reviewed: 01/12/2013 Elsevier Interactive Patient Education  2017 Elsevier Inc.  

## 2017-07-23 LAB — FOLLICLE STIMULATING HORMONE: FSH: 2.9 m[IU]/mL

## 2017-07-23 LAB — URINALYSIS, ROUTINE W REFLEX MICROSCOPIC
Bilirubin Urine: NEGATIVE
Glucose, UA: NEGATIVE
Hgb urine dipstick: NEGATIVE
Ketones, ur: NEGATIVE
LEUKOCYTES UA: NEGATIVE
NITRITE: NEGATIVE
PROTEIN: NEGATIVE
Specific Gravity, Urine: 1.007 (ref 1.001–1.03)
pH: 7.5 (ref 5.0–8.0)

## 2017-07-23 LAB — HEMOGLOBIN A1C
Hgb A1c MFr Bld: 5.4 % of total Hgb (ref <5.7)
MEAN PLASMA GLUCOSE: 108 (calc)
eAG (mmol/L): 6 (calc)

## 2017-07-23 LAB — MICROALBUMIN / CREATININE URINE RATIO
CREATININE, URINE: 23 mg/dL (ref 20–275)
MICROALB UR: 0.3 mg/dL
MICROALB/CREAT RATIO: 13 ug/mg{creat} (ref <30)

## 2017-07-23 LAB — ESTRADIOL: Estradiol: 87 pg/mL

## 2018-01-13 ENCOUNTER — Ambulatory Visit: Payer: Managed Care, Other (non HMO) | Admitting: Physician Assistant

## 2018-01-13 ENCOUNTER — Encounter: Payer: Self-pay | Admitting: Physician Assistant

## 2018-01-13 VITALS — BP 112/82 | HR 88 | Temp 98.0°F | Resp 16 | Ht 64.5 in | Wt 135.8 lb

## 2018-01-13 DIAGNOSIS — F3341 Major depressive disorder, recurrent, in partial remission: Secondary | ICD-10-CM

## 2018-01-13 DIAGNOSIS — Z720 Tobacco use: Secondary | ICD-10-CM

## 2018-01-13 MED ORDER — BUPROPION HCL ER (XL) 150 MG PO TB24: 150.0000 mg | ORAL_TABLET | ORAL | 2 refills | Status: DC

## 2018-01-13 NOTE — Progress Notes (Signed)
   Subjective:    Patient ID: Courtney Wheeler, female    DOB: 11/11/1980, 37 y.o.   MRN: [id number]  HPI 37 y.o. WF presents with depression and nicotine addiction. She has been vaping, would like to quit. She has history of depression, has seen counselor in the past. She has low mood, low self worth, relationship issues, very tearful. No respiratory issues.   Blood pressure 112/82, pulse 88, temperature 98 F (36.7 C), resp. rate 16, height 5' 4.5" (1.638 m), weight 135 lb 12.8 oz (61.6 kg), last menstrual period 12/22/2017, SpO2 96 %.  Medications Current Outpatient Medications on File Prior to Visit  Medication Sig  . Acetylcysteine (NAC 600) 600 MG CAPS Take by mouth.  . B Complex Vitamins (B COMPLEX PO) Take by mouth.  . cholecalciferol (VITAMIN D) 1000 UNITS tablet Take 1,000 Units by mouth daily.   No current facility-administered medications on file prior to visit.     Problem list She has Folliculitis; Vitamin D deficiency; and Anemia on their problem list.    Review of Systems  Constitutional: Negative.   HENT: Negative.   Respiratory: Negative.  Negative for chest tightness and shortness of breath.   Cardiovascular: Negative.   Gastrointestinal: Negative.   Genitourinary: Negative.   Musculoskeletal: Negative.   Skin: Negative.   Neurological: Negative.   Hematological: Negative.   Psychiatric/Behavioral: Positive for decreased concentration and dysphoric mood. Negative for agitation, behavioral problems, hallucinations, sleep disturbance and suicidal ideas.       Objective:   Physical Exam  Constitutional: She is oriented to person, place, and time. She appears well-developed and well-nourished.  HENT:  Head: Normocephalic and atraumatic.  Right Ear: External ear normal.  Left Ear: External ear normal.  Mouth/Throat: Oropharynx is clear and moist.  Eyes: Pupils are equal, round, and reactive to light. Conjunctivae and EOM are normal.  Neck: Normal range  of motion. Neck supple. No thyromegaly present.  Cardiovascular: Normal rate, regular rhythm and normal heart sounds. Exam reveals no gallop and no friction rub.  No murmur heard. Pulmonary/Chest: Effort normal and breath sounds normal. No respiratory distress. She has no wheezes.  Abdominal: Soft. Bowel sounds are normal. She exhibits no distension and no mass. There is no tenderness. There is no rebound and no guarding.  Musculoskeletal: Normal range of motion.  Lymphadenopathy:    She has no cervical adenopathy.  Neurological: She is alert and oriented to person, place, and time. She displays normal reflexes. No cranial nerve deficit. Coordination normal.  Skin: Skin is warm and dry.  Psychiatric: She has a normal mood and affect.       Assessment & Plan:    Depression, major, recurrent, in partial remission (HCC) Tobacco use -     buPROPion (WELLBUTRIN XL) 150 MG 24 hr tablet; Take 1 tablet (150 mg total) by mouth every morning. - will try to find counselor Will start wellbutrin Close follow up 4-6 weeks.

## 2018-01-13 NOTE — Patient Instructions (Addendum)
[place]  [employer]  [phone] for more information or for a free program for smoking cessation help.   You can call QUIT SMART 1-800-QUIT-NOW for free nicotine patches or replacement therapy- if they are out- keep calling  [facility] Can call for smoking cessation classes, [phone]  If you have a smart phone, please look up Smoke Free app, this will help you stay on track and give you information about money you have saved, life that you have gained back and a ton of more information.   We are giving you chantix for smoking cessation. You can do it! And we are here to help! You may have heard some scary side effects about chantix, the three most common I hear about are nausea, crazy dreams and depression.  However, I like for my patients to try to stay on 1/2 a tablet twice a day rather than one tablet twice a day as normally prescribed. This helps decrease the chances of side effects and helps save money by making a one month prescription last two months  Please start the prescription this way:  Start 1/2 tablet by mouth once daily after food with a full glass of water for 3 days Then do 1/2 tablet by mouth twice daily for 4 days. During this first week you can smoke, but try to stop after this week.  At this point we have several options: 1) continue on 1/2 tablet twice a day- which I encourage you to do. You can stay on this dose the rest of the time on the medication or if you still feel the need to smoke you can do one of the two options below. 2) do one tablet in the morning and 1/2 in the evening which helps decrease dreams. 3) do one tablet twice a day.   What if I miss a dose? If you miss a dose, take it as soon as you can. If it is almost time for your next dose, take only that dose. Do not take double or extra doses.  What should I watch for while using this medicine? Visit your doctor or health care professional for  regular check ups. Ask for ongoing advice and encouragement from your doctor or healthcare professional, friends, and family to help you quit. If you smoke while on this medication, quit again  Your mouth may get dry. Chewing sugarless gum or hard candy, and drinking plenty of water may help. Contact your doctor if the problem does not go away or is severe.  You may get drowsy or dizzy. Do not drive, use machinery, or do anything that needs mental alertness until you know how this medicine affects you. Do not stand or sit up quickly, especially if you are an older patient.   The use of this medicine may increase the chance of suicidal thoughts or actions. Pay special attention to how you are responding while on this medicine. Any worsening of mood, or thoughts of suicide or dying should be reported to your health care professional right away.  ADVANTAGES OF QUITTING SMOKING  Within 20 minutes, blood pressure decreases. Your pulse is at normal level.  After 8 hours, carbon monoxide levels in the blood return to normal. Your oxygen level increases.  After 24 hours, the chance of having a heart attack starts to decrease. Your breath, hair, and body stop smelling like smoke.  After 48 hours, damaged nerve endings begin to recover. Your sense of taste and  smell improve.  After 72 hours, the body is virtually free of nicotine. Your bronchial tubes relax and breathing becomes easier.  After 2 to 12 weeks, lungs can hold more air. Exercise becomes easier and circulation improves.  After 1 year, the risk of coronary heart disease is cut in half.  After 5 years, the risk of stroke falls to the same as a nonsmoker.  After 10 years, the risk of lung cancer is cut in half and the risk of other cancers decreases significantly.  After 15 years, the risk of coronary heart disease drops, usually to the level of a nonsmoker.  You will have extra money to spend on things other than  cigarettes.  Counseling services  Here are some numbers below you can try but I suggest calling your insurance and finding out who is in your network and THEN calling those people or looking them up on google.   I'm a big fan of Cognitive Behavioral Therapy, look this up on You tube or check with the therapist you see if they are certified.  This form of therapy helps to teach you skills to better handle with current situation that are causing anxiety or depression.   Bupropion tablets (Depression/Mood Disorders) What is this medicine? BUPROPION (byoo PROE pee on) is used to treat depression. This medicine may be used for other purposes; ask your health care provider or pharmacist if you have questions. COMMON BRAND NAME(S): Wellbutrin What should I tell my health care provider before I take this medicine? They need to know if you have any of these conditions: -an eating disorder, such as anorexia or bulimia -bipolar disorder or psychosis -diabetes or high blood sugar, treated with medication -glaucoma -heart disease, previous heart attack, or irregular heart beat -head injury or brain tumor -high blood pressure -kidney or liver disease -seizures -suicidal thoughts or a previous suicide attempt -Tourette's syndrome -weight loss -an unusual or allergic reaction to bupropion, other medicines, foods, dyes, or preservatives -breast-feeding -pregnant or trying to become pregnant How should I use this medicine? Take this medicine by mouth with a glass of water. Follow the directions on the prescription label. You can take it with or without food. If it upsets your stomach, take it with food. Take your medicine at regular intervals. Do not take your medicine more often than directed. Do not stop taking this medicine suddenly except upon the advice of your doctor. Stopping this medicine too quickly may cause serious side effects or your condition may worsen. A special MedGuide will be given  to you by the pharmacist with each prescription and refill. Be sure to read this information carefully each time. Talk to your pediatrician regarding the use of this medicine in children. Special care may be needed. Overdosage: If you think you have taken too much of this medicine contact a poison control center or emergency room at once. NOTE: This medicine is only for you. Do not share this medicine with others. What if I miss a dose? If you miss a dose, take it as soon as you can. If it is less than four hours to your next dose, take only that dose and skip the missed dose. Do not take double or extra doses. What may interact with this medicine? Do not take this medicine with any of the following medications: -linezolid -MAOIs like Azilect, Carbex, Eldepryl, Marplan, Nardil, and Parnate -methylene blue (injected into a vein) -other medicines that contain bupropion like Zyban This medicine may also interact  with the following medications: -alcohol -certain medicines for anxiety or sleep -certain medicines for blood pressure like metoprolol, propranolol -certain medicines for depression or psychotic disturbances -certain medicines for HIV or AIDS like efavirenz, lopinavir, nelfinavir, ritonavir -certain medicines for irregular heart beat like propafenone, flecainide -certain medicines for Parkinson's disease like amantadine, levodopa -certain medicines for seizures like carbamazepine, phenytoin, phenobarbital -cimetidine -clopidogrel -cyclophosphamide -digoxin -furazolidone -isoniazid -nicotine -orphenadrine -procarbazine -steroid medicines like prednisone or cortisone -stimulant medicines for attention disorders, weight loss, or to stay awake -tamoxifen -theophylline -thiotepa -ticlopidine -tramadol -warfarin This list may not describe all possible interactions. Give your health care provider a list of all the medicines, herbs, non-prescription drugs, or dietary supplements  you use. Also tell them if you smoke, drink alcohol, or use illegal drugs. Some items may interact with your medicine. What should I watch for while using this medicine? Tell your doctor if your symptoms do not get better or if they get worse. Visit your doctor or health care professional for regular checks on your progress. Because it may take several weeks to see the full effects of this medicine, it is important to continue your treatment as prescribed by your doctor. Patients and their families should watch out for new or worsening thoughts of suicide or depression. Also watch out for sudden changes in feelings such as feeling anxious, agitated, panicky, irritable, hostile, aggressive, impulsive, severely restless, overly excited and hyperactive, or not being able to sleep. If this happens, especially at the beginning of treatment or after a change in dose, call your health care professional. Avoid alcoholic drinks while taking this medicine. Drinking excessive alcoholic beverages, using sleeping or anxiety medicines, or quickly stopping the use of these agents while taking this medicine may increase your risk for a seizure. Do not drive or use heavy machinery until you know how this medicine affects you. This medicine can impair your ability to perform these tasks. Do not take this medicine close to bedtime. It may prevent you from sleeping. Your mouth may get dry. Chewing sugarless gum or sucking hard candy, and drinking plenty of water may help. Contact your doctor if the problem does not go away or is severe. What side effects may I notice from receiving this medicine? Side effects that you should report to your doctor or health care professional as soon as possible: -allergic reactions like skin rash, itching or hives, swelling of the face, lips, or tongue -breathing problems -changes in vision -confusion -elevated mood, decreased need for sleep, racing thoughts, impulsive behavior -fast or  irregular heartbeat -hallucinations, loss of contact with reality -increased blood pressure -redness, blistering, peeling or loosening of the skin, including inside the mouth -seizures -suicidal thoughts or other mood changes -unusually weak or tired -vomiting Side effects that usually do not require medical attention (report to your doctor or health care professional if they continue or are bothersome): -constipation -headache -loss of appetite -nausea -tremors -weight loss This list may not describe all possible side effects. Call your doctor for medical advice about side effects. You may report side effects to FDA at 1-800-FDA-1088. Where should I keep my medicine? Keep out of the reach of children. Store at room temperature between 20 and 25 degrees C (68 and 77 degrees F), away from direct sunlight and moisture. Keep tightly closed. Throw away any unused medicine after the expiration date. NOTE: This sheet is a summary. It may not cover all possible information. If you have questions about this medicine, talk to your  doctor, pharmacist, or health care provider.  2018 Elsevier/Gold Standard (2015-08-31 13:44:21)

## 2018-02-15 NOTE — Progress Notes (Signed)
Subjective:    Patient ID: Courtney Wheeler, female    DOB: 04-18-1980, 37 y.o.   MRN: [id number]  HPI 37 y.o. WF presents for follow up depression, she was started on wellbutrin last visit.   Bilateral hip pain, anterior x several months, worse with sitting for a long time and then standing, no other pain anywhere else. Does yoga/walking. No distal issues.   Blood pressure 116/78, pulse 64, temperature 98.1 F (36.7 C), height 5' 4.5" (1.638 m), weight 135 lb 3.2 oz (61.3 kg), SpO2 99 %.  BMI is Body mass index is 22.85 kg/m., she is working on diet and exercise. Wt Readings from Last 3 Encounters:  02/16/18 135 lb 3.2 oz (61.3 kg)  01/13/18 135 lb 12.8 oz (61.6 kg)  07/22/17 134 lb 3.2 oz (60.9 kg)    Medications Current Outpatient Medications on File Prior to Visit  Medication Sig  . Acetylcysteine (NAC 600) 600 MG CAPS Take by mouth.  . B Complex Vitamins (B COMPLEX PO) Take by mouth.  Marland Kitchen. buPROPion (WELLBUTRIN XL) 150 MG 24 hr tablet Take 1 tablet (150 mg total) by mouth every morning.   No current facility-administered medications on file prior to visit.     Problem list She has Folliculitis; Vitamin D deficiency; and Anemia on their problem list.   Review of Systems  Constitutional: Negative.   HENT: Negative.   Respiratory: Negative.  Negative for chest tightness and shortness of breath.   Cardiovascular: Negative.   Gastrointestinal: Negative.   Genitourinary: Negative.   Musculoskeletal: Positive for arthralgias (hip pain).  Skin: Negative.   Neurological: Negative.   Hematological: Negative.   Psychiatric/Behavioral: Positive for decreased concentration and dysphoric mood. Negative for agitation, behavioral problems, hallucinations, sleep disturbance and suicidal ideas.       Objective:   Physical Exam  Constitutional: She is oriented to person, place, and time. She appears well-developed and well-nourished.  HENT:  Head: Normocephalic and atraumatic.   Right Ear: External ear normal.  Left Ear: External ear normal.  Mouth/Throat: Oropharynx is clear and moist.  Eyes: Pupils are equal, round, and reactive to light. Conjunctivae and EOM are normal.  Neck: Normal range of motion. Neck supple. No thyromegaly present.  Cardiovascular: Normal rate, regular rhythm and normal heart sounds. Exam reveals no gallop and no friction rub.  No murmur heard. Pulmonary/Chest: Effort normal and breath sounds normal. No respiratory distress. She has no wheezes.  Abdominal: Soft. Bowel sounds are normal. She exhibits no distension and no mass. There is no tenderness. There is no rebound and no guarding.  Musculoskeletal: Normal range of motion. She exhibits no tenderness.  Negative straight leg, no SI tenderness, pain with hip extension, normal gait, good distal sensation/pulses.   Lymphadenopathy:    She has no cervical adenopathy.  Neurological: She is alert and oriented to person, place, and time. She displays normal reflexes. No cranial nerve deficit. Coordination normal.  Skin: Skin is warm and dry.  Psychiatric: She has a normal mood and affect.      Assessment & Plan:  Courtney Wheeler was seen today for follow-up.  Diagnoses and all orders for this visit:  Depression, major, recurrent, in partial remission (HCC) -     buPROPion (WELLBUTRIN XL) 150 MG 24 hr tablet; Take 2 tablets (300 mg total) by mouth every morning. - increase to 2, find counselor, follow up  Bilateral hip pain Try stretches, ? Psoas issues, if not better can refer PT/ortho    Future  Appointments  Date Time Provider Department Center  07/27/2018  9:00 AM Quentin Mulling, PA-C [id number] None

## 2018-02-16 ENCOUNTER — Ambulatory Visit: Payer: Managed Care, Other (non HMO) | Admitting: Physician Assistant

## 2018-02-16 ENCOUNTER — Encounter: Payer: Self-pay | Admitting: Physician Assistant

## 2018-02-16 VITALS — BP 116/78 | HR 64 | Temp 98.1°F | Ht 64.5 in | Wt 135.2 lb

## 2018-02-16 DIAGNOSIS — M25552 Pain in left hip: Secondary | ICD-10-CM | POA: Diagnosis not present

## 2018-02-16 DIAGNOSIS — F3341 Major depressive disorder, recurrent, in partial remission: Secondary | ICD-10-CM | POA: Diagnosis not present

## 2018-02-16 DIAGNOSIS — M25551 Pain in right hip: Secondary | ICD-10-CM | POA: Diagnosis not present

## 2018-02-16 MED ORDER — BUPROPION HCL ER (XL) 150 MG PO TB24: 300.0000 mg | ORAL_TABLET | ORAL | 2 refills | Status: DC

## 2018-05-25 ENCOUNTER — Other Ambulatory Visit: Payer: Self-pay | Admitting: Physician Assistant

## 2018-05-25 DIAGNOSIS — F3341 Major depressive disorder, recurrent, in partial remission: Secondary | ICD-10-CM

## 2018-06-07 DIAGNOSIS — F3341 Major depressive disorder, recurrent, in partial remission: Secondary | ICD-10-CM

## 2018-06-07 MED ORDER — BUPROPION HCL ER (XL) 150 MG PO TB24: 300.0000 mg | ORAL_TABLET | Freq: Every morning | ORAL | 1 refills | Status: DC

## 2018-06-27 ENCOUNTER — Other Ambulatory Visit: Payer: Self-pay | Admitting: Physician Assistant

## 2018-06-27 DIAGNOSIS — F3341 Major depressive disorder, recurrent, in partial remission: Secondary | ICD-10-CM

## 2018-07-26 NOTE — Progress Notes (Signed)
Complete Physical  Assessment and Plan:   Vitamin D deficiency - VITAMIN D 25 Hydroxy (Vit-D Deficiency, Fractures)   Anemia, unspecified anemia type - monitor, continue iron supp with Vitamin C and increase green leafy veggies   Medication management - CBC with Differential/Platelet - BASIC METABOLIC PANEL WITH GFR - Hepatic function panel - TSH - Magnesium  Screening cholesterol level - Lipid panel  Screening for blood or protein in urine - Urinalysis, Routine w reflex microscopic (not at [facility]) - Microalbumin / creatinine urine ratio   Routine general medical examination at a health care facility 1 year  Medication management -     CBC with Differential/Platelet -     BASIC METABOLIC PANEL WITH GFR -     Hepatic function panel -     Magnesium  Varicose veins - start compression stockings and elevation   Discussed med's effects and SE's. Screening labs and tests as requested with regular follow-up as recommended. Over 40 minutes of exam, counseling, chart review, and critical decision making was performed this visit.   HPI  38 y.o. female  presents for a complete physical.  Her blood pressure has been controlled at home, today their BP is BP: 118/72 She does workout, does yoga and has been doing yard work. She denies chest pain, shortness of breath, dizziness.  She has history of Raynauds, neg autoimmune work up.  She is on wellbutrin 300mg  and doing well.  She is not on cholesterol medication and denies myalgias. Her cholesterol is at goal. The cholesterol last visit was:   Lab Results  Component Value Date   CHOL 180 07/22/2017   HDL 57 07/22/2017   LDLCALC 103 (H) 07/22/2017   TRIG 100 07/22/2017   CHOLHDL 3.2 07/22/2017  She has had elevated insulin in the past. Most recent A1C in the office was:  Lab Results  Component Value Date   HGBA1C 5.4 07/22/2017  Patient is on Vitamin D supplement, she is not on it anymore, has been off x several months.     Lab Results  Component Value Date   VD25OH 42 07/21/2016  She has a history of ectopic pregnancy 2013 s/p fallopion tube removal, took out IUD, she is not on anything, follows with Dr. Rosemary Holms. She feels that she is not able to lose weight.  Wt Readings from Last 7 Encounters:  07/27/18 134 lb (60.8 kg)  02/16/18 135 lb 3.2 oz (61.3 kg)  01/13/18 135 lb 12.8 oz (61.6 kg)  07/22/17 134 lb 3.2 oz (60.9 kg)  07/21/16 136 lb 12.8 oz (62.1 kg)  07/18/16 136 lb (61.7 kg)  07/18/15 133 lb 3.2 oz (60.4 kg)   Works in [employer], [employer].  Current Medications:  Current Outpatient Medications on File Prior to Visit  Medication Sig Dispense Refill  . buPROPion (WELLBUTRIN XL) 150 MG 24 hr tablet TAKE 1 TABLET BY MOUTH EVERY DAY IN THE MORNING 30 tablet 1   No current facility-administered medications on file prior to visit.    Health Maintenance:   Immunization History  Administered Date(s) Administered  . Influenza,inj,Quad PF,6+ Mos 01/14/2017, 11/27/2017  . Influenza-Unspecified 01/14/2017, 11/27/2017  . Tdap 06/28/2012   Tetanus: 2014 Pneumovax: N/A Flu vaccine: 2019 Zostavax: N/A  Pap: 07/14/2014 states had with Dr. Billy Coast.  MGM:  Feb 2016 normal, dense breast, due next year   DEXA: N/A Colonoscopy: N/A EGD: N/A CT AB 2013 Negative Hep panel 06/2011  Patient Care Team: Lucky Cowboy, MD as PCP - General (Internal  Medicine)  Medical History:  Past Medical History:  Diagnosis Date  . Allergy    RHINITIS  . Anemia   . Blood transfusion   . Ectopic pregnancy 03/2011   ruptured just prior to having abortion  . Heart murmur   . Prediabetes   . Seizure disorder (HCC)    5th grade  . Vitamin D deficiency    Allergies Allergies  Allergen Reactions  . Penicillins Hives    SURGICAL HISTORY She  has a past surgical history that includes Dilation and curettage of uterus (03/2011) and laparotomy (04/17/2011). FAMILY HISTORY Her family history includes Arthritis in her  paternal grandmother; Asthma in her paternal grandmother; Birth defects in her paternal grandmother; Cancer in her cousin and paternal grandfather; Diabetes in her maternal grandmother; Heart disease in her maternal uncle, mother, and paternal grandfather; Hypertension in her maternal grandmother, paternal grandfather, and paternal grandmother; Mental illness in her maternal aunt; Stroke in her father and paternal grandfather. SOCIAL HISTORY She  reports that she quit smoking about 7 years ago. Her smoking use included e-cigarettes. She has never used smokeless tobacco. She reports current alcohol use. She reports that she does not use drugs.  Review of Systems: Review of Systems  Constitutional: Positive for malaise/fatigue. Negative for chills, diaphoresis, fever and weight loss.  HENT: Negative.   Eyes: Negative.   Respiratory: Negative.   Cardiovascular: Negative.   Gastrointestinal: Negative for abdominal pain, blood in stool, constipation, diarrhea, heartburn, melena, nausea and vomiting.  Genitourinary: Negative.   Musculoskeletal: Negative.   Skin: Negative.   Neurological: Negative.  Negative for weakness.  Endo/Heme/Allergies: Negative.   Psychiatric/Behavioral: Negative for depression, hallucinations, memory loss, substance abuse and suicidal ideas. The patient is nervous/anxious. The patient does not have insomnia.     Physical Exam: Estimated body mass index is 22.3 kg/m as calculated from the following:   Height as of this encounter: 5\' 5"  (1.651 m).   Weight as of this encounter: 134 lb (60.8 kg). BP 118/72   Pulse 68   Temp (!) 97.2 F (36.2 C)   Ht 5\' 5"  (1.651 m)   Wt 134 lb (60.8 kg)   LMP 07/23/2018   SpO2 99%   BMI 22.30 kg/m  General Appearance: Well nourished, in no apparent distress.  Eyes: PERRLA, EOMs, conjunctiva no swelling or erythema, normal fundi and vessels.  Sinuses: No Frontal/maxillary tenderness  ENT/Mouth: Ext aud canals clear, normal light  reflex with TMs without erythema, bulging. Good dentition. No erythema, swelling, or exudate on post pharynx. Tonsils not swollen or erythematous. Hearing normal.  Neck: Supple, thyroid normal. No bruits  Respiratory: Respiratory effort normal, BS equal bilaterally without rales, rhonchi, wheezing or stridor.  Cardio: RRR without murmurs, rubs or gallops. Brisk peripheral pulses without edema.  Chest: symmetric, with normal excursions and percussion.  Breasts: defer Abdomen: Soft, nontender, no guarding, rebound, hernias, masses, or organomegaly.  Lymphatics: Non tender without lymphadenopathy.  Genitourinary: defer Musculoskeletal: Full ROM all peripheral extremities,5/5 strength, and normal gait.  Skin: Warm, dry without rashes, lesions, ecchymosis. Neuro: Cranial nerves intact, reflexes equal bilaterally. Normal muscle tone, no cerebellar symptoms. Sensation intact.  Psych: Awake and oriented X 3, normal affect, Insight and Judgment appropriate.   EKG: declines AORTA SCAN: declines  Courtney MullingAmanda Frazer [place] 9:15 AM [facility]

## 2018-07-27 ENCOUNTER — Other Ambulatory Visit: Payer: Self-pay

## 2018-07-27 ENCOUNTER — Encounter: Payer: Self-pay | Admitting: Physician Assistant

## 2018-07-27 ENCOUNTER — Ambulatory Visit: Payer: Managed Care, Other (non HMO) | Admitting: Physician Assistant

## 2018-07-27 VITALS — BP 118/72 | HR 68 | Temp 97.2°F | Ht 65.0 in | Wt 134.0 lb

## 2018-07-27 DIAGNOSIS — Z13220 Encounter for screening for lipoid disorders: Secondary | ICD-10-CM | POA: Diagnosis not present

## 2018-07-27 DIAGNOSIS — Z0000 Encounter for general adult medical examination without abnormal findings: Secondary | ICD-10-CM

## 2018-07-27 DIAGNOSIS — Z1329 Encounter for screening for other suspected endocrine disorder: Secondary | ICD-10-CM

## 2018-07-27 DIAGNOSIS — Z1389 Encounter for screening for other disorder: Secondary | ICD-10-CM | POA: Diagnosis not present

## 2018-07-27 DIAGNOSIS — Z130 Encounter for screening for diseases of the blood and blood-forming organs and certain disorders involving the immune mechanism: Secondary | ICD-10-CM

## 2018-07-27 DIAGNOSIS — E559 Vitamin D deficiency, unspecified: Secondary | ICD-10-CM

## 2018-07-27 DIAGNOSIS — L739 Follicular disorder, unspecified: Secondary | ICD-10-CM

## 2018-07-27 DIAGNOSIS — D649 Anemia, unspecified: Secondary | ICD-10-CM

## 2018-07-27 DIAGNOSIS — Z79899 Other long term (current) drug therapy: Secondary | ICD-10-CM

## 2018-07-27 NOTE — Patient Instructions (Signed)
VARICOSE VEINS Varicose veins are veins that have become enlarged and twisted. CAUSES This condition is the result of valves in the veins not working properly. Valves in the veins help return blood from the leg to the heart. When your calf muscles squeeze, the blood moves up your leg then the valves close and this continues until the blood gets back to your heart.  If these valves are damaged, blood flows backwards and backs up into the veins in the leg near the skin OR if your are sitting/standing for a long time without using your calf muscles the blood will back up into the veins in your legs. This causes the veins to become larger. People who are on their feet a lot, sit a lot without walking (like on a plane, at a desk, or in a car), who are pregnant, or who are overweight are more likely to develop varicose veins. SYMPTOMS   Bulging, twisted-appearing, bluish veins, most commonly found on the legs.  Leg pain or a feeling of heaviness. These symptoms may be worse at the end of the day.  Leg swelling.  Skin color changes. DIAGNOSIS  Varicose veins can usually be diagnosed with an exam of your legs by your caregiver. He or she may recommend an ultrasound of your leg veins. TREATMENT  Most varicose veins can be treated at home. However, other treatments are available for people who have persistent symptoms or who want to treat the cosmetic appearance of the varicose veins. But this is only cosmetic and they will return if not properly treated. These include:  Laser treatment of very small varicose veins.  Medicine that is shot (injected) into the vein. This medicine hardens the walls of the vein and closes off the vein. This treatment is called sclerotherapy. Afterwards, you may need to wear clothing or bandages that apply pressure.  Surgery. HOME CARE INSTRUCTIONS   Do not stand or sit in one position for long periods of time. Do not sit with your legs crossed. Rest with your legs raised  during the day.  Your legs have to be higher than your heart so that gravity will force the valves to open, so please really elevate your legs.   Wear elastic stockings or support hose. Do not wear other tight, encircling garments around the legs, pelvis, or waist.  [employer]  has a wide variety of well priced compression stockings. [address] [place], [place] [address] [phone]  OR THERE ARE COPPER INFUSED COMPRESSION SOCKS AT [facility] OR [facility]  [employer] also has great cheap/afforable stockings or socks- the socks are easier to get on your feet  - can also get a jacob's donner that helps you put on the sock  Walk as much as possible to increase blood flow.  Raise the foot of your bed at night with 2-inch blocks.  If you get a cut in the skin over the vein and the vein bleeds, lie down with your leg raised and press on it with a clean cloth until the bleeding stops. Then place a bandage (dressing) on the cut. See your caregiver if it continues to bleed or needs stitches. SEEK MEDICAL CARE IF:   The skin around your ankle starts to break down.  You have pain, redness, tenderness, or hard swelling developing in your leg over a vein.  You are uncomfortable due to leg pain. Document Released: 12/18/2004 Document Revised: 06/02/2011 Document Reviewed: 05/06/2010 [facility] Patient Information 2014 [place], [place].  GENERAL HEALTH  GOALS  Know what a healthy weight is for you (roughly BMI <25) and aim to maintain this  Aim for 7+ servings of fruits and vegetables daily  70-80+ fluid ounces of water or unsweet tea for healthy kidneys  Limit to max 1 drink of alcohol per day; avoid smoking/tobacco  Limit animal fats in diet for cholesterol and heart health - choose grass fed whenever available  Avoid highly processed foods, and foods high in saturated/trans fats  Aim for low stress - take time to unwind and care for your mental health  Aim for 150 min of moderate  intensity exercise weekly for heart health, and weights twice weekly for bone health  Aim for 7-9 hours of sleep daily

## 2018-07-28 LAB — LIPID PANEL
Cholesterol: 176 mg/dL (ref <200)
HDL: 63 mg/dL (ref >=50)
LDL Cholesterol (Calc): 99 mg/dL (calc)
Non-HDL Cholesterol (Calc): 113 mg/dL (calc) (ref <130)
Total CHOL/HDL Ratio: 2.8 (calc) (ref <5.0)
Triglycerides: 57 mg/dL (ref <150)

## 2018-07-28 LAB — VITAMIN D 25 HYDROXY (VIT D DEFICIENCY, FRACTURES): Vit D, 25-Hydroxy: 50 ng/mL (ref 30–100)

## 2018-07-28 LAB — URINALYSIS, ROUTINE W REFLEX MICROSCOPIC
Bilirubin Urine: NEGATIVE
Glucose, UA: NEGATIVE
Hgb urine dipstick: NEGATIVE
Ketones, ur: NEGATIVE
Leukocytes,Ua: NEGATIVE
Nitrite: NEGATIVE
Protein, ur: NEGATIVE
Specific Gravity, Urine: 1.004 (ref 1.001–1.03)
pH: 6.5 (ref 5.0–8.0)

## 2018-07-28 LAB — COMPLETE METABOLIC PANEL WITH GFR
AG Ratio: 1.8 (calc) (ref 1.0–2.5)
ALT: 13 U/L (ref 6–29)
AST: 25 U/L (ref 10–30)
Albumin: 4.2 g/dL (ref 3.6–5.1)
Alkaline phosphatase (APISO): 33 U/L (ref 31–125)
BUN: 21 mg/dL (ref 7–25)
CO2: 24 mmol/L (ref 20–32)
Calcium: 9.2 mg/dL (ref 8.6–10.2)
Chloride: 105 mmol/L (ref 98–110)
Creat: 1 mg/dL (ref 0.50–1.10)
GFR, Est African American: 82 mL/min/{1.73_m2} (ref >=60)
GFR, Est Non African American: 71 mL/min/{1.73_m2} (ref >=60)
Globulin: 2.4 g/dL (calc) (ref 1.9–3.7)
Glucose, Bld: 90 mg/dL (ref 65–99)
Potassium: 4.5 mmol/L (ref 3.5–5.3)
Sodium: 137 mmol/L (ref 135–146)
Total Bilirubin: 0.4 mg/dL (ref 0.2–1.2)
Total Protein: 6.6 g/dL (ref 6.1–8.1)

## 2018-07-28 LAB — MICROALBUMIN / CREATININE URINE RATIO
Creatinine, Urine: 12 mg/dL — ABNORMAL LOW (ref 20–275)
Microalb Creat Ratio: 17 mcg/mg creat (ref <30)
Microalb, Ur: 0.2 mg/dL

## 2018-07-28 LAB — CBC WITH DIFFERENTIAL/PLATELET
Absolute Monocytes: 456 cells/uL (ref 200–950)
Basophils Absolute: 40 cells/uL (ref 0–200)
Basophils Relative: 1 %
Eosinophils Absolute: 32 cells/uL (ref 15–500)
Eosinophils Relative: 0.8 %
HCT: 35.1 % (ref 35.0–45.0)
Hemoglobin: 11.9 g/dL (ref 11.7–15.5)
Lymphs Abs: 1588 cells/uL (ref 850–3900)
MCH: 33.1 pg — ABNORMAL HIGH (ref 27.0–33.0)
MCHC: 33.9 g/dL (ref 32.0–36.0)
MCV: 97.5 fL (ref 80.0–100.0)
MPV: 9.6 fL (ref 7.5–12.5)
Monocytes Relative: 11.4 %
Neutro Abs: 1884 cells/uL (ref 1500–7800)
Neutrophils Relative %: 47.1 %
Platelets: 259 10*3/uL (ref 140–400)
RBC: 3.6 10*6/uL — ABNORMAL LOW (ref 3.80–5.10)
RDW: 12 % (ref 11.0–15.0)
Total Lymphocyte: 39.7 %
WBC: 4 10*3/uL (ref 3.8–10.8)

## 2018-07-28 LAB — IRON, TOTAL/TOTAL IRON BINDING CAP
%SAT: 40 % (calc) (ref 16–45)
Iron: 123 ug/dL (ref 40–190)
TIBC: 306 mcg/dL (calc) (ref 250–450)

## 2018-07-28 LAB — MAGNESIUM: Magnesium: 1.9 mg/dL (ref 1.5–2.5)

## 2018-07-28 LAB — TSH: TSH: 0.97 mIU/L

## 2018-07-28 LAB — VITAMIN B12: Vitamin B-12: 746 pg/mL (ref 200–1100)

## 2018-08-05 DIAGNOSIS — J029 Acute pharyngitis, unspecified: Secondary | ICD-10-CM

## 2018-08-05 NOTE — Telephone Encounter (Signed)
THIS ENCOUNTER IS A VIRTUAL VISIT DUE TO COVID-19 - PATIENT WAS NOT SEEN IN THE OFFICE.  PATIENT HAS CONSENTED TO VIRTUAL VISIT / TELEMEDICINE VISIT   Virtual Visit via telephone Note  I connected with Courtney Wheeler on 08/05/2018  by telephone.  I verified that I am speaking with the correct person using two identifiers.    I discussed the limitations of evaluation and management by telemedicine and the availability of in person appointments. The patient expressed understanding and agreed to proceed.  History of Present Illness: She has had sore throat x 3 days.  No fever, chills. No loss of smell.Per patient throat is red/irritated. She has a stiff neck, no myalgias.  She states she has some decreased energy.  She has been doing hot tea.  She has not been on tylenol/aleve or ibuprofen.   Mom is Takya Boswell 40 and Dad is 24.  No symptoms.   Medications  Current Outpatient Medications (Other):  Marland Kitchen  buPROPion (WELLBUTRIN XL) 150 MG 24 hr tablet, TAKE 1 TABLET BY MOUTH EVERY DAY IN THE MORNING  Problem list She has Folliculitis; Vitamin D deficiency; and Anemia on their problem list.   Observations/Objective: General Appearance:Well sounding, in no apparent distress.  ENT/Mouth: No hoarseness, No cough for duration of visit.  Respiratory: completing full sentences without distress, without audible wheeze Neuro: Awake and oriented X 3,  Psych:  Insight and Judgment appropriate.    Assessment and Plan: Likely pharyngitis/allergies, however patient has been around high risk parents and is very concerned about COVID Will monitor symptoms and will call if any worsening of symptoms Suggested symptomatic OTC remedies. Nasal saline spray for congestion. Nasal steroids, allergy pill, staying hydrated Will do self isolation at home x 2 weeks Patient understands will not break quarantine without my permission.    Follow Up Instructions:  I discussed the assessment and treatment  plan with the patient. The patient was provided an opportunity to ask questions and all were answered. The patient agreed with the plan and demonstrated an understanding of the instructions.   The patient was advised to call back or seek an in-person evaluation if the symptoms worsen or if the condition fails to improve as anticipated.  I provided 15 minutes of non-face-to-face time during this encounter.   Quentin Mulling, PA-C

## 2018-08-12 ENCOUNTER — Other Ambulatory Visit: Payer: Self-pay | Admitting: Physician Assistant

## 2018-08-12 MED ORDER — TRIAMCINOLONE ACETONIDE 0.1 % EX CREA: 1.0000 "application " | TOPICAL_CREAM | Freq: Two times a day (BID) | CUTANEOUS | 1 refills | Status: DC

## 2018-09-15 DIAGNOSIS — F3341 Major depressive disorder, recurrent, in partial remission: Secondary | ICD-10-CM

## 2018-09-15 MED ORDER — BUPROPION HCL ER (XL) 150 MG PO TB24: ORAL_TABLET | ORAL | 1 refills | Status: DC

## 2018-10-15 ENCOUNTER — Other Ambulatory Visit: Payer: Self-pay | Admitting: Internal Medicine

## 2018-10-15 DIAGNOSIS — F3341 Major depressive disorder, recurrent, in partial remission: Secondary | ICD-10-CM

## 2018-10-15 MED ORDER — BUPROPION HCL ER (XL) 150 MG PO TB24: ORAL_TABLET | ORAL | 3 refills | Status: DC

## 2018-11-10 ENCOUNTER — Other Ambulatory Visit: Payer: Self-pay

## 2018-11-10 DIAGNOSIS — Z20822 Contact with and (suspected) exposure to covid-19: Secondary | ICD-10-CM

## 2018-11-11 LAB — NOVEL CORONAVIRUS, NAA: SARS-CoV-2, NAA: NOT DETECTED

## 2018-12-15 ENCOUNTER — Other Ambulatory Visit: Payer: Self-pay

## 2018-12-15 DIAGNOSIS — Z20822 Contact with and (suspected) exposure to covid-19: Secondary | ICD-10-CM

## 2018-12-17 LAB — NOVEL CORONAVIRUS, NAA: SARS-CoV-2, NAA: NOT DETECTED

## 2018-12-29 ENCOUNTER — Other Ambulatory Visit: Payer: Self-pay

## 2018-12-29 DIAGNOSIS — Z20822 Contact with and (suspected) exposure to covid-19: Secondary | ICD-10-CM

## 2018-12-31 LAB — NOVEL CORONAVIRUS, NAA: SARS-CoV-2, NAA: NOT DETECTED

## 2019-01-03 ENCOUNTER — Other Ambulatory Visit: Payer: Self-pay | Admitting: Internal Medicine

## 2019-01-03 DIAGNOSIS — F3341 Major depressive disorder, recurrent, in partial remission: Secondary | ICD-10-CM

## 2019-01-03 MED ORDER — BUPROPION HCL ER (XL) 300 MG PO TB24: ORAL_TABLET | ORAL | 1 refills | Status: DC

## 2019-01-05 ENCOUNTER — Other Ambulatory Visit: Payer: Self-pay | Admitting: Physician Assistant

## 2019-01-05 DIAGNOSIS — F3341 Major depressive disorder, recurrent, in partial remission: Secondary | ICD-10-CM

## 2019-01-05 MED ORDER — BUPROPION HCL ER (XL) 150 MG PO TB24: ORAL_TABLET | ORAL | 3 refills | Status: DC

## 2019-04-15 LAB — HM MAMMOGRAPHY

## 2019-05-04 ENCOUNTER — Encounter: Payer: Self-pay | Admitting: *Deleted

## 2019-06-15 DIAGNOSIS — K649 Unspecified hemorrhoids: Secondary | ICD-10-CM

## 2019-08-02 NOTE — Progress Notes (Signed)
Complete Physical  Assessment and Plan:  Tobacco use -     varenicline (CHANTIX CONTINUING MONTH PAK) 1 MG tablet; Take 1 tablet (1 mg total) by mouth 2 (two) times daily. -     EKG 12-Lead -     DG Chest 2 View; Future - E cigs, will work on stopping with chantix  Pleurisy -     EKG 12-Lead -     DG Chest 2 View; Future -     Sedimentation rate -     C-reactive protein ? Slipped rib syndrome, ? Muscular, EKG WNL Check CXR and inflammation labs Advised to stop smoking   Vitamin D deficiency - VITAMIN D 25 Hydroxy (Vit-D Deficiency, Fractures)   Anemia, unspecified anemia type - monitor, continue iron supp with Vitamin C and increase green leafy veggies   Medication management - CBC with Differential/Platelet - BASIC METABOLIC PANEL WITH GFR - Hepatic function panel - TSH - Magnesium  Screening cholesterol level - Lipid panel  Screening for blood or protein in urine - Urinalysis, Routine w reflex microscopic (not at [facility]) - Microalbumin / creatinine urine ratio   Routine general medical examination at a health care facility 1 year  Medication management -     CBC with Differential/Platelet -     BASIC METABOLIC PANEL WITH GFR -     Hepatic function panel -     Magnesium  Varicose veins - start compression stockings and elevation   Discussed med's effects and SE's. Screening labs and tests as requested with regular follow-up as recommended. Over 40 minutes of exam, counseling, chart review, and critical decision making was performed this visit.   HPI  39 y.o. female  presents for a complete physical.  Her blood pressure has been controlled at home, today their BP is BP: 118/70 She does workout, does yoga and has been doing yard work. She denies chest pain, shortness of breath, dizziness.  She has history of Raynauds, neg autoimmune work up.  She is not smoking cigarettes, she is e smoking, she would like to stop smoking. She did not do well with wellbutrin,  would like to try chantix.   She has had right upper back pain with deep breathing, sharp. X 1-2 months, no SOB, no CP, no fever, chills. No rash. No AB pain, nausea.   She is not on cholesterol medication and denies myalgias. Her cholesterol is at goal. The cholesterol last visit was:   Lab Results  Component Value Date   CHOL 176 07/27/2018   HDL 63 07/27/2018   LDLCALC 99 07/27/2018   TRIG 57 07/27/2018   CHOLHDL 2.8 07/27/2018  She has had elevated insulin in the past. Most recent A1C in the office was:  Lab Results  Component Value Date   HGBA1C 5.4 07/22/2017  Patient is on Vitamin D supplement, she is not on it anymore,  She is just on a MVIT Lab Results  Component Value Date   VD25OH 50 07/27/2018  She has a history of ectopic pregnancy 2013 s/p fallopion tube removal, took out IUD, she is not on anything, follows with Dr. Braxton Feathers.  Wt Readings from Last 7 Encounters:  08/03/19 133 lb 6.4 oz (60.5 kg)  07/27/18 134 lb (60.8 kg)  02/16/18 135 lb 3.2 oz (61.3 kg)  01/13/18 135 lb 12.8 oz (61.6 kg)  07/22/17 134 lb 3.2 oz (60.9 kg)  07/21/16 136 lb 12.8 oz (62.1 kg)  07/18/16 136 lb (61.7 kg)  Current Medications:  No current outpatient medications on file prior to visit.   No current facility-administered medications on file prior to visit.   Health Maintenance:   Immunization History  Administered Date(s) Administered  . Influenza,inj,Quad PF,6+ Mos 01/14/2017, 11/27/2017  . Influenza-Unspecified 01/14/2017, 11/27/2017  . Tdap 06/28/2012   Tetanus: 2014 Pneumovax: N/A Flu vaccine: 2019 Zostavax: N/A  Pap: 2021 states had with Dr. Ronita Hipps.  MGM:  04/15/2019 normal, dense breast, due next year   DEXA: N/A Colonoscopy: N/A EGD: N/A CT AB 2013 Negative Hep panel 06/2011  Patient Care Team: Unk Pinto, MD as PCP - General (Internal Medicine)  Medical History:  Past Medical History:  Diagnosis Date  . Allergy    RHINITIS  . Anemia   . Blood  transfusion   . Ectopic pregnancy 03/2011   ruptured just prior to having abortion  . Heart murmur   . Prediabetes   . Seizure disorder (HCC)    5th grade  . Vitamin D deficiency    Allergies Allergies  Allergen Reactions  . Penicillins Hives    SURGICAL HISTORY She  has a past surgical history that includes Dilation and curettage of uterus (03/2011) and laparotomy (04/17/2011). FAMILY HISTORY Her family history includes Arthritis in her paternal grandmother; Asthma in her paternal grandmother; Birth defects in her paternal grandmother; Cancer in her cousin and paternal grandfather; Diabetes in her maternal grandmother; Heart disease in her maternal uncle, mother, and paternal grandfather; Hypertension in her maternal grandmother, paternal grandfather, and paternal grandmother; Mental illness in her maternal aunt; Stroke in her father and paternal grandfather. SOCIAL HISTORY She  reports that she quit smoking about 8 years ago. Her smoking use included e-cigarettes. She has never used smokeless tobacco. She reports current alcohol use. She reports that she does not use drugs.  Review of Systems: Review of Systems  Constitutional: Positive for malaise/fatigue. Negative for chills, diaphoresis, fever and weight loss.  HENT: Negative.   Eyes: Negative.   Respiratory: Negative.   Cardiovascular: Negative.   Gastrointestinal: Negative for abdominal pain, blood in stool, constipation, diarrhea, heartburn, melena, nausea and vomiting.  Genitourinary: Negative.   Musculoskeletal: Negative.   Skin: Negative.   Neurological: Negative.  Negative for weakness.  Endo/Heme/Allergies: Negative.   Psychiatric/Behavioral: Negative for depression, hallucinations, memory loss, substance abuse and suicidal ideas. The patient is nervous/anxious. The patient does not have insomnia.     Physical Exam: Estimated body mass index is 22.54 kg/m as calculated from the following:   Height as of this  encounter: 5' 4.5" (1.638 m).   Weight as of this encounter: 133 lb 6.4 oz (60.5 kg). BP 118/70   Pulse 61   Temp (!) 97.3 F (36.3 C)   Ht 5' 4.5" (1.638 m)   Wt 133 lb 6.4 oz (60.5 kg)   LMP 07/31/2019   SpO2 98%   BMI 22.54 kg/m  General Appearance: Well nourished, in no apparent distress.  Eyes: PERRLA, EOMs, conjunctiva no swelling or erythema, normal fundi and vessels.  Sinuses: No Frontal/maxillary tenderness  ENT/Mouth: Ext aud canals clear, normal light reflex with TMs without erythema, bulging. Good dentition. No erythema, swelling, or exudate on post pharynx. Tonsils not swollen or erythematous. Hearing normal.  Neck: Supple, thyroid normal. No bruits  Respiratory: Respiratory effort normal, BS equal bilaterally without rales, rhonchi, wheezing or stridor.  Cardio: RRR without murmurs, rubs or gallops. Brisk peripheral pulses without edema.  Chest: symmetric, with normal excursions and percussion.  Breasts: defer Abdomen: Soft,  nontender, no guarding, rebound, hernias, masses, or organomegaly.  Lymphatics: Non tender without lymphadenopathy.  Genitourinary: defer Musculoskeletal: Full ROM all peripheral extremities,5/5 strength, and normal gait.  Skin: Warm, dry without rashes, lesions, ecchymosis. Neuro: Cranial nerves intact, reflexes equal bilaterally. Normal muscle tone, no cerebellar symptoms. Sensation intact.  Psych: Awake and oriented X 3, normal affect, Insight and Judgment appropriate.   EKG: declines AORTA SCAN: declines  Quentin Mulling 9:11 AM [facility]

## 2019-08-03 ENCOUNTER — Encounter: Payer: Self-pay | Admitting: Physician Assistant

## 2019-08-03 ENCOUNTER — Other Ambulatory Visit: Payer: Self-pay

## 2019-08-03 ENCOUNTER — Ambulatory Visit: Payer: Managed Care, Other (non HMO) | Admitting: Physician Assistant

## 2019-08-03 VITALS — BP 118/70 | HR 61 | Temp 97.3°F | Ht 64.5 in | Wt 133.4 lb

## 2019-08-03 DIAGNOSIS — D649 Anemia, unspecified: Secondary | ICD-10-CM

## 2019-08-03 DIAGNOSIS — L739 Follicular disorder, unspecified: Secondary | ICD-10-CM

## 2019-08-03 DIAGNOSIS — Z1389 Encounter for screening for other disorder: Secondary | ICD-10-CM

## 2019-08-03 DIAGNOSIS — E559 Vitamin D deficiency, unspecified: Secondary | ICD-10-CM | POA: Diagnosis not present

## 2019-08-03 DIAGNOSIS — Z13220 Encounter for screening for lipoid disorders: Secondary | ICD-10-CM | POA: Diagnosis not present

## 2019-08-03 DIAGNOSIS — Z0000 Encounter for general adult medical examination without abnormal findings: Secondary | ICD-10-CM

## 2019-08-03 DIAGNOSIS — Z1329 Encounter for screening for other suspected endocrine disorder: Secondary | ICD-10-CM

## 2019-08-03 DIAGNOSIS — Z136 Encounter for screening for cardiovascular disorders: Secondary | ICD-10-CM

## 2019-08-03 DIAGNOSIS — Z79899 Other long term (current) drug therapy: Secondary | ICD-10-CM

## 2019-08-03 DIAGNOSIS — R091 Pleurisy: Secondary | ICD-10-CM

## 2019-08-03 DIAGNOSIS — Z130 Encounter for screening for diseases of the blood and blood-forming organs and certain disorders involving the immune mechanism: Secondary | ICD-10-CM | POA: Diagnosis not present

## 2019-08-03 DIAGNOSIS — I10 Essential (primary) hypertension: Secondary | ICD-10-CM

## 2019-08-03 DIAGNOSIS — Z0001 Encounter for general adult medical examination with abnormal findings: Secondary | ICD-10-CM

## 2019-08-03 DIAGNOSIS — Z720 Tobacco use: Secondary | ICD-10-CM

## 2019-08-03 MED ORDER — VARENICLINE TARTRATE 1 MG PO TABS: 1.0000 mg | ORAL_TABLET | Freq: Two times a day (BID) | ORAL | 2 refills | Status: DC

## 2019-08-03 NOTE — Patient Instructions (Addendum)
TUMERIC FOR INFLAMMATION  Tumeric with black pepper extract is a great natural antiinflammatory that helps with arthritis and aches and pain. Can get from [employer] or any health food store. Need to take at least 800mg  twice a day with food TO [place].   OR   Aleve is an antiinflammatory, can take 1 pill twice a day for 2 weeks and then take as needed.  You can take tylenol (500mg ) or tylenol arthritis (650mg ) with the meloxicam/antiinflammatories. The max you can take of tylenol a day is 3000mg  daily, this is a max of 6 pills a day of the regular tyelnol (500mg ) or a max of 4 a day of the tylenol arthritis (650mg ) as long as no other medications you are taking contain tylenol.   Aleve can cause inflammation in your stomach and can cause ulcers or bleeding, this will look like black tarry stools Make sure you take your aleve with food Can take with pepcid  CAN CHECK OUT Dr. Imagene Sheller    IF THIS DOES NOT HELP AFTER 2 WEEKS SUGGEST CHEST XRAY  INFORMATION ABOUT YOUR XRAY  Can walk into [address] for an Insurance account manager. They will have the order and take you back. You do not any paper work, I should get the result back today or tomorrow. This order is good for a year.  Can call [phone] to schedule an appointment if you wish.    Costochondritis  Costochondritis is swelling and irritation (inflammation) of the tissue (cartilage) that connects your ribs to your breastbone (sternum). This causes pain in the front of your chest. The pain usually starts gradually and involves more than one rib. What are the causes? The exact cause of this condition is not always known. It results from stress on the cartilage where your ribs attach to your sternum. The cause of this stress could be:  Chest injury (trauma).  Exercise or activity, such as lifting.  Severe coughing. What increases the risk? You may be at higher risk for this condition if you:  Are female.  Are 80?40 years  old.  Recently started a new exercise or work activity.  Have low levels of vitamin D.  Have a condition that makes you cough frequently. What are the signs or symptoms? The main symptom of this condition is chest pain. The pain:  Usually starts gradually and can be sharp or dull.  Gets worse with deep breathing, coughing, or exercise.  Gets better with rest.  May be worse when you press on the sternum-rib connection (tenderness). How is this diagnosed? This condition is diagnosed based on your symptoms, medical history, and a physical exam. Your health care provider will check for tenderness when pressing on your sternum. This is the most important finding. You may also have tests to rule out other causes of chest pain. These may include:  A chest X-ray to check for lung problems.  An electrocardiogram (ECG) to see if you have a heart problem that could be causing the pain.  An imaging scan to rule out a chest or rib fracture. How is this treated? This condition usually goes away on its own over time. Your health care provider may prescribe an NSAID to reduce pain and inflammation. Your health care provider may also suggest that you:  Rest and avoid activities that make pain worse.  Apply heat or cold to the area to reduce pain and inflammation.  Do exercises to stretch your chest muscles. If these treatments do  not help, your health care provider may inject a numbing medicine at the sternum-rib connection to help relieve the pain. Follow these instructions at home:  Avoid activities that make pain worse. This includes any activities that use chest, abdominal, and side muscles.  If directed, put ice on the painful area: ? Put ice in a plastic bag. ? Place a towel between your skin and the bag. ? Leave the ice on for 20 minutes, 2-3 times a day.  If directed, apply heat to the affected area as often as told by your health care provider. Use the heat source that your health  care provider recommends, such as a moist heat pack or a heating pad. ? Place a towel between your skin and the heat source. ? Leave the heat on for 20-30 minutes. ? Remove the heat if your skin turns bright red. This is especially important if you are unable to feel pain, heat, or cold. You may have a greater risk of getting burned.  Take over-the-counter and prescription medicines only as told by your health care provider.  Return to your normal activities as told by your health care provider. Ask your health care provider what activities are safe for you.  Keep all follow-up visits as told by your health care provider. This is important. Contact a health care provider if:  You have chills or a fever.  Your pain does not go away or it gets worse.  You have a cough that does not go away (is persistent). Get help right away if:  You have shortness of breath. This information is not intended to replace advice given to you by your health care provider. Make sure you discuss any questions you have with your health care provider. Document Revised: 03/25/2017 Document Reviewed: 07/04/2015 Elsevier Patient Education  2020 Elsevier Inc.   Pleurisy  Pleurisy, also called pleuritis, is irritation and swelling (inflammation) of the linings of the lungs. The linings of the lungs are called pleura. They cover the outside of the lungs and the inside of the chest wall. There is a small amount of fluid (pleural fluid) between the pleura that allows the lungs to move in and out smoothly when you breathe. Pleurisy causes the pleura to be rough and dry and to rub together when you breathe, which is painful. In some cases, pleurisy can cause pleural fluid to build up between the pleura (pleural effusion). What are the causes? Common causes of this condition include:  A lung infection caused by bacteria or a virus.  A blood clot that travels to the lung (pulmonary embolism).  Air leaking into the  pleural space (pneumothorax).  Lung cancer or a lung tumor.  A chest injury.  Diseases that can cause lung inflammation. These include rheumatoid arthritis, lupus, sickle cell disease, inflammatory bowel disease, and pancreatitis.  Heart or chest surgery.  Lung damage from inhaling asbestos.  A lung reaction to certain medicines. Sometimes the cause is unknown. What are the signs or symptoms? Chest pain is the main symptom of this condition. The pain is usually on one side. Chest pain may start suddenly and be sharp or stabbing. It may become a constant dull ache. You may also feel pain in your back or shoulder. The pain may get worse when you cough, take deep breaths, or make sudden movements. Other symptoms may include:  Shortness of breath.  Noisy breathing (wheezing).  Cough.  Chills.  Fever. How is this diagnosed? This condition may be diagnosed  based on:  Your medical history.  Your symptoms.  A physical exam. Your health care provider will listen to your breathing with a stethoscope to check for a rough, rubbing sound (friction rub). If you have pleural effusion, your breathing sounds may be muffled.  Tests, such as: ? Blood tests to check for infections or diseases and to measure the oxygen in your blood. ? Imaging studies of your lungs. These may include a chest X-ray, ultrasound, MRI, or CT scan. ? A procedure to remove pleural fluid with a needle for testing (thoracentesis). How is this treated? Treatment for this condition depends on the cause. Pleurisy that was caused by a virus usually clears up within 2 weeks. Treatment for pleurisy may include:  NSAIDs to help relieve pain and swelling.  Antibiotic medicines, if your condition was caused by a bacterial infection.  Prescription pain or cough medicine.  Medicines to dissolve a blood clot, if your condition was caused by pulmonary embolism.  Removal of pleural fluid or air. Follow these instructions at  home: Medicines  Take over-the-counter and prescription medicines only as told by your health care provider.  If you were prescribed an antibiotic, take it as told by your health care provider. Do not stop taking the antibiotic even if you start to feel better. Activity  Rest and return to your normal activities as told by your health care provider. Ask your health care provider what activities are safe for you.  Do not drive or use heavy machinery while taking prescription pain medicine. General instructions   Monitor your pleurisy for any changes.  Take deep breaths often, even if it is painful. This can help prevent lung infection (pneumonia) and collapse of lung tissue (atelectasis).  When lying down, lie on your painful side. This may reduce pain.  Do not smoke. If you need help quitting, ask your health care provider.  Keep all follow-up visits as told by your health care provider. This is important. Contact a health care provider if:  You have pain that: ? Gets worse. ? Does not get better with medicine. ? Lasts for more than 1 week.  You have a fever or chills.  Your cough or shortness of breath is not improving at home.  You cough up pus-like (purulent) secretions. Get help right away if:  Your lips, fingernails, or toenails darken or turn blue.  You cough up blood.  You have any of the following symptoms that get worse: ? Difficulty breathing. ? Shortness of breath. ? Wheezing.  You have pain that spreads into your neck, arms, or jaw.  You develop a rash.  You vomit.  You faint. Summary  Pleurisy is inflammation of the linings of the lungs (pleura).  Pleurisy causes pain that makes it difficult for you to breathe or cough.  Pleurisy is often caused by an underlying infection or disease.  Treatment of pleurisy depends on the cause, and it often includes medicines. This information is not intended to replace advice given to you by your health care  provider. Make sure you discuss any questions you have with your health care provider. Document Revised: 02/20/2017 Document Reviewed: 12/03/2015 Elsevier Patient Education  2020 Elsevier Inc.   SMOKING CESSATION WITH [place]  [employer]  203-560-4355 for more information or for a free program for smoking cessation help.   You can call QUIT SMART 1-800-QUIT-NOW for free nicotine patches or replacement therapy- if they are out- keep calling  [facility] Can  call for smoking cessation classes, [phone]  If you have a smart phone, please look up Smoke Free app, this will help you stay on track and give you information about money you have saved, life that you have gained back and a ton of more information.   We are giving you chantix for smoking cessation. You can do it! And we are here to help! You may have heard some scary side effects about chantix, the three most common I hear about are nausea, crazy dreams and depression.  However, I like for my patients to try to stay on 1/2 a tablet twice a day rather than one tablet twice a day as normally prescribed. This helps decrease the chances of side effects and helps save money by making a one month prescription last two months  Please start the prescription this way:  Start 1/2 tablet by mouth once daily after food with a full glass of water for 3 days Then do 1/2 tablet by mouth twice daily for 4 days. During this first week you can smoke, but try to stop after this week.  At this point we have several options: 1) continue on 1/2 tablet twice a day- which I encourage you to do. You can stay on this dose the rest of the time on the medication or if you still feel the need to smoke you can do one of the two options below. 2) do one tablet in the morning and 1/2 in the evening which helps decrease dreams. 3) do one tablet twice a day.   What if I miss a dose? If you miss a dose, take it as soon as you can. If  it is almost time for your next dose, take only that dose. Do not take double or extra doses.  What should I watch for while using this medicine? Visit your doctor or health care professional for regular check ups. Ask for ongoing advice and encouragement from your doctor or healthcare professional, friends, and family to help you quit. If you smoke while on this medication, quit again  Your mouth may get dry. Chewing sugarless gum or hard candy, and drinking plenty of water may help. Contact your doctor if the problem does not go away or is severe.  You may get drowsy or dizzy. Do not drive, use machinery, or do anything that needs mental alertness until you know how this medicine affects you. Do not stand or sit up quickly, especially if you are an older patient.   The use of this medicine may increase the chance of suicidal thoughts or actions. Pay special attention to how you are responding while on this medicine. Any worsening of mood, or thoughts of suicide or dying should be reported to your health care professional right away.  ADVANTAGES OF QUITTING SMOKING  Within 20 minutes, blood pressure decreases. Your pulse is at normal level.  After 8 hours, carbon monoxide levels in the blood return to normal. Your oxygen level increases.  After 24 hours, the chance of having a heart attack starts to decrease. Your breath, hair, and body stop smelling like smoke.  After 48 hours, damaged nerve endings begin to recover. Your sense of taste and smell improve.  After 72 hours, the body is virtually free of nicotine. Your bronchial tubes relax and breathing becomes easier.  After 2 to 12 weeks, lungs can hold more air. Exercise becomes easier and circulation improves.  After 1 year, the risk of coronary heart disease is  cut in half.  After 5 years, the risk of stroke falls to the same as a nonsmoker.  After 10 years, the risk of lung cancer is cut in half and the risk of other cancers  decreases significantly.  After 15 years, the risk of coronary heart disease drops, usually to the level of a nonsmoker.  You will have extra money to spend on things other than cigarettes.

## 2019-08-04 LAB — LIPID PANEL
Cholesterol: 187 mg/dL (ref <200)
HDL: 58 mg/dL (ref >=50)
LDL Cholesterol (Calc): 111 mg/dL (calc) — ABNORMAL HIGH
Non-HDL Cholesterol (Calc): 129 mg/dL (calc) (ref <130)
Total CHOL/HDL Ratio: 3.2 (calc) (ref <5.0)
Triglycerides: 88 mg/dL (ref <150)

## 2019-08-04 LAB — COMPLETE METABOLIC PANEL WITH GFR
AG Ratio: 1.8 (calc) (ref 1.0–2.5)
ALT: 14 U/L (ref 6–29)
AST: 20 U/L (ref 10–30)
Albumin: 4.6 g/dL (ref 3.6–5.1)
Alkaline phosphatase (APISO): 31 U/L (ref 31–125)
BUN: 20 mg/dL (ref 7–25)
CO2: 27 mmol/L (ref 20–32)
Calcium: 9.3 mg/dL (ref 8.6–10.2)
Chloride: 103 mmol/L (ref 98–110)
Creat: 0.88 mg/dL (ref 0.50–1.10)
GFR, Est African American: 95 mL/min/{1.73_m2} (ref >=60)
GFR, Est Non African American: 82 mL/min/{1.73_m2} (ref >=60)
Globulin: 2.5 g/dL (calc) (ref 1.9–3.7)
Glucose, Bld: 91 mg/dL (ref 65–99)
Potassium: 4.1 mmol/L (ref 3.5–5.3)
Sodium: 138 mmol/L (ref 135–146)
Total Bilirubin: 0.4 mg/dL (ref 0.2–1.2)
Total Protein: 7.1 g/dL (ref 6.1–8.1)

## 2019-08-04 LAB — CBC WITH DIFFERENTIAL/PLATELET
Absolute Monocytes: 423 cells/uL (ref 200–950)
Basophils Absolute: 51 cells/uL (ref 0–200)
Basophils Relative: 1 %
Eosinophils Absolute: 31 cells/uL (ref 15–500)
Eosinophils Relative: 0.6 %
HCT: 40.7 % (ref 35.0–45.0)
Hemoglobin: 13.3 g/dL (ref 11.7–15.5)
Lymphs Abs: 1734 cells/uL (ref 850–3900)
MCH: 32.3 pg (ref 27.0–33.0)
MCHC: 32.7 g/dL (ref 32.0–36.0)
MCV: 98.8 fL (ref 80.0–100.0)
MPV: 9.9 fL (ref 7.5–12.5)
Monocytes Relative: 8.3 %
Neutro Abs: 2861 cells/uL (ref 1500–7800)
Neutrophils Relative %: 56.1 %
Platelets: 255 10*3/uL (ref 140–400)
RBC: 4.12 10*6/uL (ref 3.80–5.10)
RDW: 11.8 % (ref 11.0–15.0)
Total Lymphocyte: 34 %
WBC: 5.1 10*3/uL (ref 3.8–10.8)

## 2019-08-04 LAB — URINALYSIS, ROUTINE W REFLEX MICROSCOPIC
Bilirubin Urine: NEGATIVE
Glucose, UA: NEGATIVE
Hgb urine dipstick: NEGATIVE
Ketones, ur: NEGATIVE
Leukocytes,Ua: NEGATIVE
Nitrite: NEGATIVE
Protein, ur: NEGATIVE
Specific Gravity, Urine: 1.01 (ref 1.001–1.03)
pH: 7 (ref 5.0–8.0)

## 2019-08-04 LAB — IRON, TOTAL/TOTAL IRON BINDING CAP
%SAT: 24 % (calc) (ref 16–45)
Iron: 85 ug/dL (ref 40–190)
TIBC: 360 mcg/dL (calc) (ref 250–450)

## 2019-08-04 LAB — MICROALBUMIN / CREATININE URINE RATIO
Creatinine, Urine: 37 mg/dL (ref 20–275)
Microalb, Ur: <0.2 mg/dL

## 2019-08-04 LAB — VITAMIN B12: Vitamin B-12: 1875 pg/mL — ABNORMAL HIGH (ref 200–1100)

## 2019-08-04 LAB — SEDIMENTATION RATE: Sed Rate: 6 mm/h (ref 0–20)

## 2019-08-04 LAB — TSH: TSH: 0.99 mIU/L

## 2019-08-04 LAB — C-REACTIVE PROTEIN: CRP: 0.4 mg/L (ref <8.0)

## 2019-08-04 LAB — VITAMIN D 25 HYDROXY (VIT D DEFICIENCY, FRACTURES): Vit D, 25-Hydroxy: 53 ng/mL (ref 30–100)

## 2019-08-04 LAB — MAGNESIUM: Magnesium: 2.1 mg/dL (ref 1.5–2.5)

## 2020-08-02 ENCOUNTER — Encounter: Payer: Managed Care, Other (non HMO) | Admitting: Adult Health Nurse Practitioner

## 2020-08-16 DIAGNOSIS — E785 Hyperlipidemia, unspecified: Secondary | ICD-10-CM | POA: Insufficient documentation

## 2020-08-16 NOTE — Progress Notes (Signed)
Complete Physical  Assessment and Plan:   Routine general medical examination at a health care facility 1 year  Tobacco use/ E. Cigs - Quit cigarettes, currently E cigs, will work on stopping with chantix - slow taper, stop at lowest effective dose - resources given, can also try CBT/counseling -     varenicline (CHANTIX CONTINUING MONTH PAK) 1 MG tablet; Take 1 tablet (1 mg total) by mouth 2 (two) times daily. -     DG Chest 2 View; Future   Vitamin D deficiency - VITAMIN D 25 Hydroxy (Vit-D Deficiency, Fractures) - declined today, continues same supplement  Hyperlipidemia Mild elevations managed by lifestyle  Continue low cholesterol diet and exercise.  Check lipid panel.  - Lipid panel - TSH  Screening thyroid - TSH  Screening for blood or protein in urine - Urinalysis, Routine w reflex microscopic (not at [facility])  Medication management -     CBC with Differential/Platelet -     CMP/GFR   Discussed med's effects and SE's. Screening labs and tests as requested with regular follow-up as recommended. Over 40 minutes of exam, counseling, chart review, and critical decision making was performed this visit.   Future Appointments  Date Time Provider Department Center  08/21/2021 10:00 AM Judd Gaudier, NP [facility] None     HPI  40 y.o. female  presents for a complete physical. She has Folliculitis; Vitamin D deficiency; Hyperlipidemia; and Raynaud's disease without gangrene on their problem list.  She is single and happy, not currently sexually acute in over a year, declines STD testing. She has a history of ectopic pregnancy 2013 s/p fallopion tube removal, had IUD following but has since removed, follows with Dr. Braxton Feathers annually. Has some PTSD, has seen some counselors.  She is a former smoker, now vaping (lowest level nicotine, but uses very frequently); She is interested in quitting; didn't do well with wellbutrin, was prescribed chantix last year but never started,  had some concerns about SE profile. Discussed at length and receptive to trying. Last CXR in 2016 was normal.   She has Raynauds in bil hands, neg autoimmune work up. Manages with lifestyle changes.   BMI is Body mass index is 22.11 kg/m., she has been working on diet and exercise. Very little meat  Wt Readings from Last 3 Encounters:  08/17/20 128 lb 12.8 oz (58.4 kg)  08/03/19 133 lb 6.4 oz (60.5 kg)  07/27/18 134 lb (60.8 kg)   Her blood pressure has been controlled at home, today their BP is BP: 94/60 She does workout, does yoga and has been doing yard work. She denies chest pain, shortness of breath, dizziness.   She is not on cholesterol medication and denies myalgias. Her cholesterol is not at goal. The cholesterol last visit was:   Lab Results  Component Value Date   CHOL 187 08/03/2019   HDL 58 08/03/2019   LDLCALC 111 (H) 08/03/2019   TRIG 88 08/03/2019   CHOLHDL 3.2 08/03/2019   She has had elevated insulin in the past. Most recent A1C in the office was:  Lab Results  Component Value Date   HGBA1C 5.4 07/22/2017   Patient was on Vitamin D supplement, she is not on it anymore,  She is just on a MVIT Lab Results  Component Value Date   VD25OH 74 08/03/2019   She is not on a B12 supplement, only multivitamin, requests recheck today Lab Results  Component Value Date   VITAMINB12 1,875 (H) 08/03/2019  Current Medications:  Current Outpatient Medications on File Prior to Visit  Medication Sig Dispense Refill  . varenicline (CHANTIX CONTINUING MONTH PAK) 1 MG tablet Take 1 tablet (1 mg total) by mouth 2 (two) times daily. 60 tablet 2   No current facility-administered medications on file prior to visit.   Health Maintenance:   Immunization History  Administered Date(s) Administered  . Influenza,inj,Quad PF,6+ Mos 01/14/2017, 11/27/2017  . Influenza-Unspecified 01/14/2017, 11/27/2017  . PFIZER(Purple Top)SARS-COV-2 Vaccination 06/02/2019, 06/28/2019,  01/09/2020  . Tdap 06/28/2012   Tetanus: 2014 Pneumovax: N/A Flu vaccine: 2021 at work HPV: hx of, never had vaccine   Pap: 2021 states had with Dr. Billy Coast, has scheduled next 08/24/2020 MGM:  04/15/2019 normal, dense breast, GYN on 08/24/2020  DEXA: N/A  CXR: 2016, ordered today  Colonoscopy: age 56 EGD: N/A  Last eye: Dr. Dione Booze, glasses/contacts, goes annually, has scheduled, left eye tumor/fleckle, monitored annually and measured, unchanged in several years Last dental: 07/2020, goes q21m Last derm: 07/2020, follows annually  Patient Care Team: Lucky Cowboy, MD as PCP - General (Internal Medicine)  Medical History:  Past Medical History:  Diagnosis Date  . Allergy    RHINITIS  . Anemia   . Blood transfusion   . Ectopic pregnancy 03/2011   ruptured just prior to having abortion  . Heart murmur   . Prediabetes   . Seizure disorder (HCC)    5th grade  . Vitamin D deficiency    Allergies Allergies  Allergen Reactions  . Penicillins Hives    SURGICAL HISTORY She  has a past surgical history that includes Dilation and curettage of uterus (03/2011); laparotomy (04/17/2011); and Salpingectomy (Right, 04/17/2011). FAMILY HISTORY Her family history includes Arthritis in her paternal grandmother; Asthma in her paternal grandmother; Birth defects in her paternal grandmother; Breast cancer in her cousin and paternal grandmother; COPD in her maternal grandfather; Cancer in her paternal grandfather; Diabetes in her maternal grandmother; Heart disease in her maternal uncle, mother, and paternal grandfather; Hypertension in her maternal grandmother, paternal grandfather, and paternal grandmother; Idiopathic pulmonary fibrosis in her maternal uncle; Mental illness in her maternal aunt; Other in her maternal aunt; Stroke in her father and paternal grandfather. SOCIAL HISTORY She  reports that she quit smoking about 9 years ago. Her smoking use included e-cigarettes and cigarettes. She has  never used smokeless tobacco. She reports current alcohol use of about 4.0 standard drinks of alcohol per week. She reports that she does not use drugs.  Review of Systems: Review of Systems  Constitutional: Negative for malaise/fatigue and weight loss.  HENT: Negative for hearing loss and tinnitus.   Eyes: Negative for blurred vision and double vision.  Respiratory: Negative for cough, shortness of breath and wheezing.   Cardiovascular: Negative for chest pain, palpitations, orthopnea, claudication and leg swelling.  Gastrointestinal: Negative for abdominal pain, blood in stool, constipation, diarrhea, heartburn, melena, nausea and vomiting.  Genitourinary: Negative.   Musculoskeletal: Negative for joint pain and myalgias.  Skin: Negative for rash.  Neurological: Negative for dizziness, tingling, sensory change, weakness and headaches.  Endo/Heme/Allergies: Negative for polydipsia.  Psychiatric/Behavioral: Negative.   All other systems reviewed and are negative.   Physical Exam: Estimated body mass index is 22.11 kg/m as calculated from the following:   Height as of this encounter: 5\' 4"  (1.626 m).   Weight as of this encounter: 128 lb 12.8 oz (58.4 kg). BP 94/60   Pulse (!) 56   Temp (!) 96.6 F (35.9 C)  Ht 5\' 4"  (1.626 m)   Wt 128 lb 12.8 oz (58.4 kg)   LMP 07/23/2020   SpO2 99%   BMI 22.11 kg/m  General Appearance: Well nourished/slim, well dressed adult female in no apparent distress.  Eyes: PERRLA, EOMs, conjunctiva no swelling or erythema Sinuses: No Frontal/maxillary tenderness  ENT/Mouth: Ext aud canals clear, normal light reflex with TMs without erythema, bulging. Good dentition. No erythema, swelling, or exudate on post pharynx. Tonsils not swollen or erythematous. Hearing normal.  Neck: Supple, thyroid normal. No bruits  Respiratory: Respiratory effort normal, BS equal bilaterally without rales, rhonchi, wheezing or stridor.  Cardio: RRR without murmurs, rubs or  gallops. Brisk peripheral pulses without edema.  Chest: symmetric, with normal excursions and percussion.  Breasts: defer to GYN Abdomen: Soft, nontender, no guarding, rebound, hernias, masses, or organomegaly.  Lymphatics: Non tender without lymphadenopathy.  Genitourinary: defer to GYN Musculoskeletal: Full ROM all peripheral extremities,5/5 strength, and normal gait.  Skin: Warm, dry without rashes, lesions, ecchymosis. Neuro: Cranial nerves intact, reflexes equal bilaterally. Normal muscle tone, no cerebellar symptoms. Sensation intact.  Psych: Awake and oriented X 3, normal affect, Insight and Judgment appropriate.   EKG: sinus bradycardia in 07/2019 reviewed, defer   08/2019 10:47 AM [facility]

## 2020-08-17 ENCOUNTER — Other Ambulatory Visit: Payer: Self-pay

## 2020-08-17 ENCOUNTER — Ambulatory Visit: Payer: Managed Care, Other (non HMO) | Admitting: Adult Health

## 2020-08-17 ENCOUNTER — Encounter: Payer: Self-pay | Admitting: Adult Health

## 2020-08-17 ENCOUNTER — Ambulatory Visit
Admission: RE | Admit: 2020-08-17 | Discharge: 2020-08-17 | Disposition: A | Discharge status: Home / Self Care | Payer: 59 | Source: Ambulatory Visit | Attending: Adult Health | Admitting: Adult Health

## 2020-08-17 VITALS — BP 94/60 | HR 56 | Temp 96.6°F | Ht 64.0 in | Wt 128.8 lb

## 2020-08-17 DIAGNOSIS — I7300 Raynaud's syndrome without gangrene: Secondary | ICD-10-CM | POA: Insufficient documentation

## 2020-08-17 DIAGNOSIS — E538 Deficiency of other specified B group vitamins: Secondary | ICD-10-CM | POA: Insufficient documentation

## 2020-08-17 DIAGNOSIS — Z87891 Personal history of nicotine dependence: Secondary | ICD-10-CM | POA: Insufficient documentation

## 2020-08-17 DIAGNOSIS — Z0000 Encounter for general adult medical examination without abnormal findings: Secondary | ICD-10-CM

## 2020-08-17 DIAGNOSIS — Z1329 Encounter for screening for other suspected endocrine disorder: Secondary | ICD-10-CM

## 2020-08-17 DIAGNOSIS — E559 Vitamin D deficiency, unspecified: Secondary | ICD-10-CM

## 2020-08-17 DIAGNOSIS — Z789 Other specified health status: Secondary | ICD-10-CM

## 2020-08-17 DIAGNOSIS — L739 Follicular disorder, unspecified: Secondary | ICD-10-CM

## 2020-08-17 DIAGNOSIS — D649 Anemia, unspecified: Secondary | ICD-10-CM

## 2020-08-17 DIAGNOSIS — Z79899 Other long term (current) drug therapy: Secondary | ICD-10-CM

## 2020-08-17 DIAGNOSIS — Z1389 Encounter for screening for other disorder: Secondary | ICD-10-CM

## 2020-08-17 DIAGNOSIS — Z6822 Body mass index (BMI) 22.0-22.9, adult: Secondary | ICD-10-CM

## 2020-08-17 DIAGNOSIS — E785 Hyperlipidemia, unspecified: Secondary | ICD-10-CM

## 2020-08-17 NOTE — Patient Instructions (Addendum)
Recommend increasing water intake - slowly up to 60+ fluid ounces Fiber intake daily 25+ grams - this is minimum, more is better  Chest xray at [address] [facility]- no appointment, just walk in    Look into CBT resources online - [employer], books, apps    SMOKING CESSATION WITH CHANTIX  [employer]  208-080-2997 for more information or for a free program for smoking cessation help.   You can call QUIT SMART 1-800-QUIT-NOW for free nicotine patches or replacement therapy- if they are out- keep calling  [facility] Can call for smoking cessation classes, ([phone]  If you have a smart phone, please look up Smoke Free app, this will help you stay on track and give you information about money you have saved, life that you have gained back and a ton of more information.   We are giving you chantix for smoking cessation. You can do it! And we are here to help! You may have heard some scary side effects about chantix, the three most common I hear about are nausea, crazy dreams and depression.  However, I like for my patients to try to stay on 1/2 a tablet twice a day rather than one tablet twice a day as normally prescribed. This helps decrease the chances of side effects and helps save money by making a one month prescription last two months  Please start the prescription this way:  Start 1/2 tablet by mouth once daily after food with a full glass of water for 3 days Then do 1/2 tablet by mouth twice daily for 4 days. During this first week you can smoke, but try to stop after this week.  At this point we have several options: 1) continue on 1/2 tablet twice a day- which I encourage you to do. You can stay on this dose the rest of the time on the medication or if you still feel the need to smoke you can do one of the two options below. 2) do one tablet in the morning and 1/2 in the evening which helps decrease dreams. 3) do one tablet twice  a day.   What if I miss a dose? If you miss a dose, take it as soon as you can. If it is almost time for your next dose, take only that dose. Do not take double or extra doses.  What should I watch for while using this medicine? Visit your doctor or health care professional for regular check ups. Ask for ongoing advice and encouragement from your doctor or healthcare professional, friends, and family to help you quit. If you smoke while on this medication, quit again  Your mouth may get dry. Chewing sugarless gum or hard candy, and drinking plenty of water may help. Contact your doctor if the problem does not go away or is severe.  You may get drowsy or dizzy. Do not drive, use machinery, or do anything that needs mental alertness until you know how this medicine affects you. Do not stand or sit up quickly, especially if you are an older patient.   The use of this medicine may increase the chance of suicidal thoughts or actions. Pay special attention to how you are responding while on this medicine. Any worsening of mood, or thoughts of suicide or dying should be reported to your health care professional right away.  ADVANTAGES OF QUITTING SMOKING  Within 20 minutes, blood pressure decreases. Your pulse is at normal level.  After 8 hours, carbon monoxide levels in the blood return to normal. Your oxygen level increases.  After 24 hours, the chance of having a heart attack starts to decrease. Your breath, hair, and body stop smelling like smoke.  After 48 hours, damaged nerve endings begin to recover. Your sense of taste and smell improve.  After 72 hours, the body is virtually free of nicotine. Your bronchial tubes relax and breathing becomes easier.  After 2 to 12 weeks, lungs can hold more air. Exercise becomes easier and circulation improves.  After 1 year, the risk of coronary heart disease is cut in half.  After 5 years, the risk of stroke falls to the same as a nonsmoker.  After  10 years, the risk of lung cancer is cut in half and the risk of other cancers decreases significantly.  After 15 years, the risk of coronary heart disease drops, usually to the level of a nonsmoker.  You will have extra money to spend on things other than cigarettes.     High-Fiber Eating Plan Fiber, also called dietary fiber, is a type of carbohydrate. It is found foods such as fruits, vegetables, whole grains, and beans. A high-fiber diet can have many health benefits. Your health care provider may recommend a high-fiber diet to help:  Prevent constipation. Fiber can make your bowel movements more regular.  Lower your cholesterol.  Relieve the following conditions: ? Inflammation of veins in the anus (hemorrhoids). ? Inflammation of specific areas of the digestive tract (uncomplicated diverticulosis). ? A problem of the large intestine, also called the colon, that sometimes causes pain and diarrhea (irritable bowel syndrome, or IBS).  Prevent overeating as part of a weight-loss plan.  Prevent heart disease, type 2 diabetes, and certain cancers. What are tips for following this plan? Reading food labels  Check the nutrition facts label on food products for the amount of dietary fiber. Choose foods that have 5 grams of fiber or more per serving.  The goals for recommended daily fiber intake include: ? Men (age 53 or younger): 34-38 g. ? Men (over age 37): 28-34 g. ? Women (age 1 or younger): 25-28 g. ? Women (over age 36): 22-25 g. Your daily fiber goal is _____________ g.   Shopping  Choose whole fruits and vegetables instead of processed forms, such as apple juice or applesauce.  Choose a wide variety of high-fiber foods such as avocados, lentils, oats, and kidney beans.  Read the nutrition facts label of the foods you choose. Be aware of foods with added fiber. These foods often have high sugar and sodium amounts per serving. Cooking  Use whole-grain flour for baking  and cooking.  Cook with brown rice instead of white rice. Meal planning  Start the day with a breakfast that is high in fiber, such as a cereal that contains 5 g of fiber or more per serving.  Eat breads and cereals that are made with whole-grain flour instead of refined flour or white flour.  Eat brown rice, bulgur wheat, or millet instead of white rice.  Use beans in place of meat in soups, salads, and pasta dishes.  Be sure that half of the grains you eat each day are whole grains. General information  You can get the recommended daily intake of dietary fiber by: ? Eating a variety of fruits, vegetables, grains, nuts, and beans. ? Taking a fiber supplement if you are not able to take in enough fiber in your diet. It is better  to get fiber through food than from a supplement.  Gradually increase how much fiber you consume. If you increase your intake of dietary fiber too quickly, you may have bloating, cramping, or gas.  Drink plenty of water to help you digest fiber.  Choose high-fiber snacks, such as berries, raw vegetables, nuts, and popcorn. What foods should I eat? Fruits Berries. Pears. Apples. Oranges. Avocado. Prunes and raisins. Dried figs. Vegetables Sweet potatoes. Spinach. Kale. Artichokes. Cabbage. Broccoli. Cauliflower. Green peas. Carrots. Squash. Grains Whole-grain breads. Multigrain cereal. Oats and oatmeal. Brown rice. Barley. Bulgur wheat. Millet. Quinoa. Bran muffins. Popcorn. Rye wafer crackers. Meats and other proteins Navy beans, kidney beans, and pinto beans. Soybeans. Split peas. Lentils. Nuts and seeds. Dairy Fiber-fortified yogurt. Beverages Fiber-fortified soy milk. Fiber-fortified orange juice. Other foods Fiber bars. The items listed above may not be a complete list of recommended foods and beverages. Contact a dietitian for more information. What foods should I avoid? Fruits Fruit juice. Cooked, strained fruit. Vegetables Fried potatoes.  Canned vegetables. Well-cooked vegetables. Grains White bread. Pasta made with refined flour. White rice. Meats and other proteins Fatty cuts of meat. Fried chicken or fried fish. Dairy Milk. Yogurt. Cream cheese. Sour cream. Fats and oils Butters. Beverages Soft drinks. Other foods Cakes and pastries. The items listed above may not be a complete list of foods and beverages to avoid. Talk with your dietitian about what choices are best for you. Summary  Fiber is a type of carbohydrate. It is found in foods such as fruits, vegetables, whole grains, and beans.  A high-fiber diet has many benefits. It can help to prevent constipation, lower blood cholesterol, aid weight loss, and reduce your risk of heart disease, diabetes, and certain cancers.  Increase your intake of fiber gradually. Increasing fiber too quickly may cause cramping, bloating, and gas. Drink plenty of water while you increase the amount of fiber you consume.  The best sources of fiber include whole fruits and vegetables, whole grains, nuts, seeds, and beans. This information is not intended to replace advice given to you by your health care provider. Make sure you discuss any questions you have with your health care provider. Document Revised: 07/14/2019 Document Reviewed: 07/14/2019 Elsevier Patient Education  2021 [employer].

## 2020-08-18 ENCOUNTER — Encounter: Payer: Self-pay | Admitting: Adult Health

## 2020-08-18 DIAGNOSIS — N182 Chronic kidney disease, stage 2 (mild): Secondary | ICD-10-CM | POA: Insufficient documentation

## 2020-08-18 LAB — URINALYSIS, ROUTINE W REFLEX MICROSCOPIC
Bilirubin Urine: NEGATIVE
Glucose, UA: NEGATIVE
Hgb urine dipstick: NEGATIVE
Ketones, ur: NEGATIVE
Leukocytes,Ua: NEGATIVE
Nitrite: NEGATIVE
Protein, ur: NEGATIVE
Specific Gravity, Urine: 1.004 (ref 1.001–1.035)
pH: 5.5 (ref 5.0–8.0)

## 2020-08-18 LAB — COMPLETE METABOLIC PANEL WITH GFR
AG Ratio: 1.6 (calc) (ref 1.0–2.5)
ALT: 17 U/L (ref 6–29)
AST: 23 U/L (ref 10–30)
Albumin: 4.7 g/dL (ref 3.6–5.1)
Alkaline phosphatase (APISO): 33 U/L (ref 31–125)
BUN: 13 mg/dL (ref 7–25)
CO2: 22 mmol/L (ref 20–32)
Calcium: 9.7 mg/dL (ref 8.6–10.2)
Chloride: 103 mmol/L (ref 98–110)
Creat: 0.88 mg/dL (ref 0.50–1.10)
GFR, Est African American: 95 mL/min/{1.73_m2} (ref >=60)
GFR, Est Non African American: 82 mL/min/{1.73_m2} (ref >=60)
Globulin: 3 g/dL (calc) (ref 1.9–3.7)
Glucose, Bld: 90 mg/dL (ref 65–99)
Potassium: 4 mmol/L (ref 3.5–5.3)
Sodium: 137 mmol/L (ref 135–146)
Total Bilirubin: 0.5 mg/dL (ref 0.2–1.2)
Total Protein: 7.7 g/dL (ref 6.1–8.1)

## 2020-08-18 LAB — LIPID PANEL
Cholesterol: 177 mg/dL (ref <200)
HDL: 59 mg/dL (ref >=50)
LDL Cholesterol (Calc): 102 mg/dL (calc) — ABNORMAL HIGH
Non-HDL Cholesterol (Calc): 118 mg/dL (calc) (ref <130)
Total CHOL/HDL Ratio: 3 (calc) (ref <5.0)
Triglycerides: 75 mg/dL (ref <150)

## 2020-08-18 LAB — CBC WITH DIFFERENTIAL/PLATELET
Absolute Monocytes: 394 cells/uL (ref 200–950)
Basophils Absolute: 52 cells/uL (ref 0–200)
Basophils Relative: 0.9 %
Eosinophils Absolute: 17 cells/uL (ref 15–500)
Eosinophils Relative: 0.3 %
HCT: 40.9 % (ref 35.0–45.0)
Hemoglobin: 13.3 g/dL (ref 11.7–15.5)
Lymphs Abs: 1897 cells/uL (ref 850–3900)
MCH: 31.5 pg (ref 27.0–33.0)
MCHC: 32.5 g/dL (ref 32.0–36.0)
MCV: 96.9 fL (ref 80.0–100.0)
MPV: 10.6 fL (ref 7.5–12.5)
Monocytes Relative: 6.8 %
Neutro Abs: 3439 cells/uL (ref 1500–7800)
Neutrophils Relative %: 59.3 %
Platelets: 238 10*3/uL (ref 140–400)
RBC: 4.22 10*6/uL (ref 3.80–5.10)
RDW: 12 % (ref 11.0–15.0)
Total Lymphocyte: 32.7 %
WBC: 5.8 10*3/uL (ref 3.8–10.8)

## 2020-08-18 LAB — TSH: TSH: 0.97 mIU/L

## 2020-08-18 LAB — IRON,TIBC AND FERRITIN PANEL
%SAT: 35 % (calc) (ref 16–45)
Ferritin: 19 ng/mL (ref 16–232)
Iron: 131 ug/dL (ref 40–190)
TIBC: 370 mcg/dL (calc) (ref 250–450)

## 2020-08-18 LAB — VITAMIN B12: Vitamin B-12: 991 pg/mL (ref 200–1100)

## 2021-03-13 ENCOUNTER — Ambulatory Visit
Admission: RE | Admit: 2021-03-13 | Discharge: 2021-03-13 | Disposition: A | Discharge status: Home / Self Care | Payer: 59 | Source: Ambulatory Visit | Attending: Adult Health | Admitting: Adult Health

## 2021-03-13 ENCOUNTER — Other Ambulatory Visit: Payer: Self-pay | Admitting: Adult Health

## 2021-03-13 ENCOUNTER — Other Ambulatory Visit: Payer: Self-pay

## 2021-03-13 DIAGNOSIS — M542 Cervicalgia: Secondary | ICD-10-CM

## 2021-03-13 MED ORDER — PREDNISONE 20 MG PO TABS: ORAL_TABLET | ORAL | 0 refills | Status: DC

## 2021-03-13 MED ORDER — PREDNISONE 20 MG PO TABS: ORAL_TABLET | ORAL | 0 refills | Status: AC

## 2021-03-14 ENCOUNTER — Encounter: Payer: Self-pay | Admitting: Adult Health

## 2021-07-30 ENCOUNTER — Other Ambulatory Visit: Payer: Self-pay | Admitting: Orthopaedic Surgery

## 2021-07-30 DIAGNOSIS — M51369 Other intervertebral disc degeneration, lumbar region without mention of lumbar back pain or lower extremity pain: Secondary | ICD-10-CM

## 2021-07-30 DIAGNOSIS — M5136 Other intervertebral disc degeneration, lumbar region: Secondary | ICD-10-CM

## 2021-08-05 ENCOUNTER — Ambulatory Visit
Admission: RE | Admit: 2021-08-05 | Discharge: 2021-08-05 | Disposition: A | Discharge status: Home / Self Care | Payer: 59 | Source: Ambulatory Visit | Attending: Orthopaedic Surgery | Admitting: Orthopaedic Surgery

## 2021-08-05 DIAGNOSIS — M5136 Other intervertebral disc degeneration, lumbar region: Secondary | ICD-10-CM

## 2021-08-05 DIAGNOSIS — M51369 Other intervertebral disc degeneration, lumbar region without mention of lumbar back pain or lower extremity pain: Secondary | ICD-10-CM

## 2021-08-20 NOTE — Progress Notes (Unsigned)
Complete Physical  Assessment and Plan:  Encounter for Annual Physical Exam with abnormal findings Due annually  Health Maintenance reviewed Healthy lifestyle reviewed and goals set  Tobacco use/ E. Cigs - Quit cigarettes, currently E cigs, will work on stopping with chantix *** - slow taper, stop at lowest effective dose - resources given, can also try CBT/counseling -     varenicline (CHANTIX CONTINUING MONTH PAK) 1 MG tablet; Take 1 tablet (1 mg total) by mouth 2 (two) times daily. -     DG Chest 2 View; Future   Vitamin D deficiency - VITAMIN D 25 Hydroxy (Vit-D Deficiency, Fractures) - declined today, continues same supplement  Hyperlipidemia Mild elevations managed by lifestyle  Continue low cholesterol diet and exercise.  Check lipid panel.  - Lipid panel - TSH  Screening thyroid - TSH  Screening for blood or protein in urine - Urinalysis, Routine w reflex microscopic (not at [facility])  Medication management -     CBC with Differential/Platelet -     CMP/GFR  ***   Discussed med's effects and SE's. Screening labs and tests as requested with regular follow-up as recommended. Over 40 minutes of exam, counseling, chart review, and critical decision making was performed this visit.   Future Appointments  Date Time Provider [place]  08/21/2021 10:00 AM Liane Comber, NP [facility] None  08/22/2022 10:00 AM Magda Bernheim, NP [facility] None     HPI  41 y.o. female  presents for a complete physical. She has Vitamin D deficiency; Hyperlipidemia; Raynaud's disease without gangrene; Electronic cigarette use; B12 deficiency; and CKD (chronic kidney disease) stage 2, GFR 60-89 ml/min on their problem list.  She is single and happy, not currently sexually active in over a year, declines STD testing. She has a history of ectopic pregnancy 2013 s/p fallopion tube removal, follows with Dr. Cherylann Banas annually. Has some PTSD, has seen some counselors.  She is a former  smoker, now vaping (lowest level nicotine, but uses very frequently); She is interested in quitting; didn't do well with wellbutrin, was prescribed chantix last year but never started, had some concerns about SE profile. Discussed at length and receptive to trying ***. Last CXR in 2016 was normal.   She has Raynauds in bil hands, neg autoimmune work up. Manages with lifestyle changes.   BMI is There is no height or weight on file to calculate BMI., she has been working on diet and exercise. Very little meat  Wt Readings from Last 3 Encounters:  08/17/20 128 lb 12.8 oz (58.4 kg)  08/03/19 133 lb 6.4 oz (60.5 kg)  07/27/18 134 lb (60.8 kg)   Her blood pressure has been controlled at home, today their BP is   She does workout, does yoga and has been doing yard work. She denies chest pain, shortness of breath, dizziness.   She is not on cholesterol medication and denies myalgias. Her cholesterol is not at goal. The cholesterol last visit was:   Lab Results  Component Value Date   CHOL 177 08/17/2020   HDL 59 08/17/2020   LDLCALC 102 (H) 08/17/2020   TRIG 75 08/17/2020   CHOLHDL 3.0 08/17/2020   She has had elevated insulin in the past. Most recent A1C in the office was:  Lab Results  Component Value Date   HGBA1C 5.4 07/22/2017   Patient was on Vitamin D supplement, she is not on it anymore,  She is just on a MVIT Lab Results  Component Value Date  VD25OH 53 08/03/2019   She is not on a B12 supplement, only multivitamin, requests recheck today Lab Results  Component Value Date   [id number] 08/17/2020     Current Medications:  Current Outpatient Medications on File Prior to Visit  Medication Sig Dispense Refill   varenicline (CHANTIX CONTINUING MONTH PAK) 1 MG tablet Take 1 tablet (1 mg total) by mouth 2 (two) times daily. 60 tablet 2   No current facility-administered medications on file prior to visit.   Health Maintenance:   Immunization History  Administered  Date(s) Administered   Influenza,inj,Quad PF,6+ Mos 01/14/2017, 11/27/2017   Influenza-Unspecified 01/14/2017, 11/27/2017   PFIZER(Purple Top)SARS-COV-2 Vaccination 06/02/2019, 06/28/2019, 01/09/2020   Tdap 06/28/2012   Tetanus: 2014 Pneumovax: N/A Flu vaccine: 2021 at work HPV: hx of, never had vaccine   Pap: 2021 states had with Dr. Ronita Hipps, has scheduled next 08/24/2020 MGM:  04/15/2019 normal, dense breast, GYN on 08/24/2020  DEXA: N/A  CXR: 2016, ordered today  Colonoscopy: age 1 EGD: N/A  Last eye: Dr. Katy Fitch, glasses/contacts, goes annually, has scheduled, left eye tumor/fleckle, monitored annually and measured, unchanged in several years Last dental: 07/2020, goes q49m Last derm: 07/2020, follows annually  Patient Care Team: Liane Comber, NP as PCP - General (Nurse Practitioner)  Medical History:  Past Medical History:  Diagnosis Date   Allergy    RHINITIS   Anemia    Blood transfusion    Ectopic pregnancy 03/2011   ruptured just prior to having abortion   Heart murmur    Prediabetes    Seizure disorder ([place])    5th grade   Vitamin D deficiency    Allergies Allergies  Allergen Reactions   Penicillins Hives    SURGICAL HISTORY She  has a past surgical history that includes Dilation and curettage of uterus (03/2011); laparotomy (04/17/2011); and Salpingectomy (Right, 04/17/2011). FAMILY HISTORY Her family history includes Arthritis in her paternal grandmother; Asthma in her paternal grandmother; Birth defects in her paternal grandmother; Breast cancer in her cousin and paternal grandmother; COPD in her maternal grandfather; Cancer in her paternal grandfather; Diabetes in her maternal grandmother; Heart disease in her maternal uncle, mother, and paternal grandfather; Hypertension in her maternal grandmother, paternal grandfather, and paternal grandmother; Idiopathic pulmonary fibrosis in her maternal uncle; Mental illness in her maternal aunt; Other in her maternal  aunt; Stroke in her father and paternal grandfather. SOCIAL HISTORY She  reports that she quit smoking about 10 years ago. Her smoking use included e-cigarettes and cigarettes. She has never used smokeless tobacco. She reports current alcohol use of about 4.0 standard drinks per week. She reports that she does not use drugs.  Review of Systems: Review of Systems  Constitutional:  Negative for malaise/fatigue and weight loss.  HENT:  Negative for hearing loss and tinnitus.   Eyes:  Negative for blurred vision and double vision.  Respiratory:  Negative for cough, shortness of breath and wheezing.   Cardiovascular:  Negative for chest pain, palpitations, orthopnea, claudication and leg swelling.  Gastrointestinal:  Negative for abdominal pain, blood in stool, constipation, diarrhea, heartburn, melena, nausea and vomiting.  Genitourinary: Negative.   Musculoskeletal:  Negative for joint pain and myalgias.  Skin:  Negative for rash.  Neurological:  Negative for dizziness, tingling, sensory change, weakness and headaches.  Endo/Heme/Allergies:  Negative for polydipsia.  Psychiatric/Behavioral: Negative.    All other systems reviewed and are negative.  Physical Exam: Estimated body mass index is 22.11 kg/m as calculated from the following:  Height as of 08/17/20: 5\' 4"  (1.626 m).   Weight as of 08/17/20: 128 lb 12.8 oz (58.4 kg). There were no vitals taken for this visit. General Appearance: Well nourished/slim, well dressed adult female in no apparent distress.  Eyes: PERRLA, EOMs, conjunctiva no swelling or erythema Sinuses: No Frontal/maxillary tenderness  ENT/Mouth: Ext aud canals clear, normal light reflex with TMs without erythema, bulging. Good dentition. No erythema, swelling, or exudate on post pharynx. Tonsils not swollen or erythematous. Hearing normal.  Neck: Supple, thyroid normal. No bruits  Respiratory: Respiratory effort normal, BS equal bilaterally without rales, rhonchi,  wheezing or stridor.  Cardio: RRR without murmurs, rubs or gallops. Brisk peripheral pulses without edema.  Chest: symmetric, with normal excursions and percussion.  Breasts: defer to GYN Abdomen: Soft, nontender, no guarding, rebound, hernias, masses, or organomegaly.  Lymphatics: Non tender without lymphadenopathy.  Genitourinary: defer to GYN Musculoskeletal: Full ROM all peripheral extremities,5/5 strength, and normal gait.  Skin: Warm, dry without rashes, lesions, ecchymosis. Neuro: Cranial nerves intact, reflexes equal bilaterally. Normal muscle tone, no cerebellar symptoms. Sensation intact.  Psych: Awake and oriented X 3, normal affect, Insight and Judgment appropriate.   EKG: sinus bradycardia in 07/2019 reviewed, defer   Izora Ribas 5:18 PM [facility]

## 2021-08-21 ENCOUNTER — Ambulatory Visit: Payer: 59 | Admitting: Adult Health

## 2021-08-21 ENCOUNTER — Encounter: Payer: Self-pay | Admitting: Adult Health

## 2021-08-21 VITALS — BP 95/76 | HR 63 | Temp 96.6°F | Ht 64.5 in | Wt 141.0 lb

## 2021-08-21 DIAGNOSIS — Z7251 High risk heterosexual behavior: Secondary | ICD-10-CM

## 2021-08-21 DIAGNOSIS — N182 Chronic kidney disease, stage 2 (mild): Secondary | ICD-10-CM

## 2021-08-21 DIAGNOSIS — Z113 Encounter for screening for infections with a predominantly sexual mode of transmission: Secondary | ICD-10-CM

## 2021-08-21 DIAGNOSIS — Z0000 Encounter for general adult medical examination without abnormal findings: Secondary | ICD-10-CM | POA: Diagnosis not present

## 2021-08-21 DIAGNOSIS — I7300 Raynaud's syndrome without gangrene: Secondary | ICD-10-CM

## 2021-08-21 DIAGNOSIS — Z0001 Encounter for general adult medical examination with abnormal findings: Secondary | ICD-10-CM

## 2021-08-21 DIAGNOSIS — F419 Anxiety disorder, unspecified: Secondary | ICD-10-CM

## 2021-08-21 DIAGNOSIS — Z6823 Body mass index (BMI) 23.0-23.9, adult: Secondary | ICD-10-CM

## 2021-08-21 DIAGNOSIS — Z1329 Encounter for screening for other suspected endocrine disorder: Secondary | ICD-10-CM

## 2021-08-21 DIAGNOSIS — E559 Vitamin D deficiency, unspecified: Secondary | ICD-10-CM

## 2021-08-21 DIAGNOSIS — Z789 Other specified health status: Secondary | ICD-10-CM

## 2021-08-21 DIAGNOSIS — E785 Hyperlipidemia, unspecified: Secondary | ICD-10-CM

## 2021-08-21 DIAGNOSIS — F439 Reaction to severe stress, unspecified: Secondary | ICD-10-CM

## 2021-08-21 DIAGNOSIS — Z1389 Encounter for screening for other disorder: Secondary | ICD-10-CM

## 2021-08-21 DIAGNOSIS — Z87891 Personal history of nicotine dependence: Secondary | ICD-10-CM

## 2021-08-21 DIAGNOSIS — Z6822 Body mass index (BMI) 22.0-22.9, adult: Secondary | ICD-10-CM

## 2021-08-21 DIAGNOSIS — E538 Deficiency of other specified B group vitamins: Secondary | ICD-10-CM

## 2021-08-21 MED ORDER — BUSPIRONE HCL 10 MG PO TABS: 5.0000 mg | ORAL_TABLET | Freq: Three times a day (TID) | ORAL | 0 refills | Status: DC | PRN

## 2021-08-21 NOTE — Patient Instructions (Addendum)
[name] , Thank you for taking time to come for your Annual Wellness Visit. I appreciate your ongoing commitment to your health goals. Please review the following plan we discussed and let me know if I can assist you in the future.   This is a list of the screening recommended for you and due dates:  Health Maintenance  Topic Date Due   HIV Screening  Never done   COVID-19 Vaccine (5 - Booster for Pfizer series) 02/26/2021   Mammogram  08/24/2021   Flu Shot  10/22/2021   Tetanus Vaccine  06/29/2022   Pap Smear  08/25/2023   HPV Vaccine  Aged Out   Hepatitis C Screening: USPSTF Recommendation to screen - Ages 18-79 yo.  Discontinued       Managing Stress, Adult Feeling a certain amount of stress is normal. Stress helps our body and mind get ready to deal with the demands of life. Stress hormones can motivate you to do well at work and meet your responsibilities. But severe or long-term (chronic) stress can affect your mental and physical health. Chronic stress puts you at higher risk for: Anxiety and depression. Other health problems such as digestive problems, muscle aches, heart disease, high blood pressure, and stroke. What are the causes? Common causes of stress include: Demands from work, such as deadlines, feeling overworked, or having long hours. Pressures at home, such as money issues, disagreements with a spouse, or parenting issues. Pressures from major life changes, such as divorce, moving, loss of a loved one, or chronic illness. You may be at higher risk for stress-related problems if you: Do not get enough sleep. Are in poor health. Do not have emotional support. Have a mental health disorder such as anxiety or depression. How to recognize stress Stress can make you: Have trouble sleeping. Feel sad, anxious, irritable, or overwhelmed. Lose your appetite. Overeat or want to eat unhealthy foods. Want to use drugs or alcohol. Stress can also cause physical  symptoms, such as: Sore, tense muscles, especially in the shoulders and neck. Headaches. Trouble breathing. A faster heart rate. Stomach pain, nausea, or vomiting. Diarrhea or constipation. Trouble concentrating. Follow these instructions at home: Eating and drinking Eat a healthy diet. This includes: Eating foods that are high in fiber, such as beans, whole grains, and fresh fruits and vegetables. Limiting foods that are high in fat and processed sugars, such as fried or sweet foods. Do not skip meals or overeat. Drink enough fluid to keep your urine pale yellow. Alcohol use Do not drink alcohol if: Your health care provider tells you not to drink. You are pregnant, may be pregnant, or are planning to become pregnant. Drinking alcohol is a way some people try to ease their stress. This can be dangerous, so if you drink alcohol: Limit how much you have to: 0-1 drink a day for women. 0-2 drinks a day for men. Know how much alcohol is in your drink. In the [place] one drink equals one 12 oz bottle of beer (355 mL), one 5 oz glass of wine (148 mL), or one 1 oz glass of hard liquor (44 mL). Activity  Include 30 minutes of exercise in your daily schedule. Exercise is a good stress reducer. Include time in your day for an activity that you find relaxing. Try taking a walk, going on a bike ride, reading a book, or listening to music. Schedule your time in a way that lowers stress, and keep a regular schedule. Focus on doing  what is most important to get done. Lifestyle Identify the source of your stress and your reaction to it. See a therapist who can help you change unhelpful reactions. When there are stressful events: Talk about them with family, friends, or coworkers. Try to think realistically about stressful events and not ignore them or overreact. Try to find the positives in a stressful situation and not focus on the negatives. Cut back on responsibilities at work and home, if  possible. Ask for help from friends or family members if you need it. Find ways to manage stress, such as: Mindfulness, meditation, or deep breathing. Yoga or tai chi. Progressive muscle relaxation. Spending time in nature. Doing art, playing music, or reading. Making time for fun activities. Spending time with family and friends. Get support from family, friends, or spiritual resources. General instructions Get enough sleep. Try to go to sleep and get up at about the same time every day. Take over-the-counter and prescription medicines only as told by your health care provider. Do not use any products that contain nicotine or tobacco. These products include cigarettes, chewing tobacco, and vaping devices, such as e-cigarettes. If you need help quitting, ask your health care provider. Do not use drugs or smoke to deal with stress. Keep all follow-up visits. This is important. Where to find support Talk with your health care provider about stress management or finding a support group. Find a therapist to work with you on your stress management techniques. Where to find more information [employer] on Mental Illness: www.nami.org American Psychological Association: TVStereos.ch Contact a health care provider if: Your stress symptoms get worse. You are unable to manage your stress at home. You are struggling to stop using drugs or alcohol. Get help right away if: You may be a danger to yourself or others. You have any thoughts of death or suicide. Get help right awayif you feel like you may hurt yourself or others, or have thoughts about taking your own life. Go to your nearest emergency room or: Call 911. Call the [place] at 463-491-5660 or 988 in the [place] This is open 24 hours a day. Text the Crisis Text Line at 859-526-4299. Summary Feeling a certain amount of stress is normal, but severe or long-term (chronic) stress can affect your mental and  physical health. Chronic stress can put you at higher risk for anxiety, depression, and other health problems such as digestive problems, muscle aches, heart disease, high blood pressure, and stroke. You may be at higher risk for stress-related problems if you do not get enough sleep, are in poor health, lack emotional support, or have a mental health disorder such as anxiety or depression. Identify the source of your stress and your reaction to it. Try talking about stressful events with family, friends, or coworkers, finding a coping method, or getting support from spiritual resources. If you need more help, talk with your health care provider about finding a support group or a mental health therapist. This information is not intended to replace advice given to you by your health care provider. Make sure you discuss any questions you have with your health care provider. Document Revised: 10/04/2020 Document Reviewed: 10/02/2020 Elsevier Patient Education  [place].         Buspirone Tablets What is this medication? BUSPIRONE (byoo SPYE rone) treats anxiety. It works by balancing the levels of dopamine and serotonin in your brain, hormones that help regulate mood. This medicine may be used for other  purposes; ask your health care provider or pharmacist if you have questions. COMMON BRAND NAME(S): BuSpar, Buspar Dividose What should I tell my care team before I take this medication? They need to know if you have any of these conditions: Kidney or liver disease An unusual or allergic reaction to buspirone, other medications, foods, dyes, or preservatives Pregnant or trying to get pregnant Breast-feeding How should I use this medication? Take this medication by mouth with a glass of water. Follow the directions on the prescription label. You may take this medication with or without food. To ensure that this medication always works the same way for you, you should take it either  always with or always without food. Take your doses at regular intervals. Do not take your medication more often than directed. Do not stop taking except on the advice of your care team. Talk to your care team about the use of this medication in children. Special care may be needed. Overdosage: If you think you have taken too much of this medicine contact a poison control center or emergency room at once. NOTE: This medicine is only for you. Do not share this medicine with others. What if I miss a dose? If you miss a dose, take it as soon as you can. If it is almost time for your next dose, take only that dose. Do not take double or extra doses. What may interact with this medication? Do not take this medication with any of the following: Linezolid MAOIs like Carbex, Eldepryl, Marplan, Nardil, and Parnate Methylene blue Procarbazine This medication may also interact with the following: Diazepam Digoxin Diltiazem Erythromycin Grapefruit juice Haloperidol Medications for mental depression or mood problems Medications for seizures like carbamazepine, phenobarbital and phenytoin Nefazodone Other medications for anxiety Rifampin Ritonavir Some antifungal medications like itraconazole, ketoconazole, and voriconazole Verapamil Warfarin This list may not describe all possible interactions. Give your health care provider a list of all the medicines, herbs, non-prescription drugs, or dietary supplements you use. Also tell them if you smoke, drink alcohol, or use illegal drugs. Some items may interact with your medicine. What should I watch for while using this medication? Visit your care team for regular checks on your progress. It may take 1 to 2 weeks before your anxiety gets better. You may get drowsy or dizzy. Do not drive, use machinery, or do anything that needs mental alertness until you know how this medication affects you. Do not stand or sit up quickly, especially if you are an older  patient. This reduces the risk of dizzy or fainting spells. Alcohol can make you more drowsy and dizzy. Avoid alcoholic drinks. What side effects may I notice from receiving this medication? Side effects that you should report to your care team as soon as possible: Allergic reactions--skin rash, itching, hives, swelling of the face, lips, tongue, or throat Irritability, confusion, fast or irregular heartbeat, muscle stiffness, twitching muscles, sweating, high fever, seizure, chills, vomiting, diarrhea, which may be signs of serotonin syndrome Side effects that usually do not require medical attention (report to your care team if they continue or are bothersome): Anxiety or nervousness Dizziness Drowsiness Headache Nausea Trouble sleeping This list may not describe all possible side effects. Call your doctor for medical advice about side effects. You may report side effects to FDA at 1-800-FDA-1088. Where should I keep my medication? Keep out of the reach of children. Store at room temperature below 30 degrees C (86 degrees F). Protect from light. Keep container tightly  closed. Throw away any unused medication after the expiration date. NOTE: This sheet is a summary. It may not cover all possible information. If you have questions about this medicine, talk to your doctor, pharmacist, or health care provider.  2023 Elsevier/Gold Standard (2020-06-07 00:00:00)    Buspirone Tablets What is this medication? BUSPIRONE (byoo SPYE rone) treats anxiety. It works by balancing the levels of dopamine and serotonin in your brain, hormones that help regulate mood. This medicine may be used for other purposes; ask your health care provider or pharmacist if you have questions. COMMON BRAND NAME(S): BuSpar, Buspar Dividose What should I tell my care team before I take this medication? They need to know if you have any of these conditions: Kidney or liver disease An unusual or allergic reaction to  buspirone, other medications, foods, dyes, or preservatives Pregnant or trying to get pregnant Breast-feeding How should I use this medication? Take this medication by mouth with a glass of water. Follow the directions on the prescription label. You may take this medication with or without food. To ensure that this medication always works the same way for you, you should take it either always with or always without food. Take your doses at regular intervals. Do not take your medication more often than directed. Do not stop taking except on the advice of your care team. Talk to your care team about the use of this medication in children. Special care may be needed. Overdosage: If you think you have taken too much of this medicine contact a poison control center or emergency room at once. NOTE: This medicine is only for you. Do not share this medicine with others. What if I miss a dose? If you miss a dose, take it as soon as you can. If it is almost time for your next dose, take only that dose. Do not take double or extra doses. What may interact with this medication? Do not take this medication with any of the following: Linezolid MAOIs like Carbex, Eldepryl, Marplan, Nardil, and Parnate Methylene blue Procarbazine This medication may also interact with the following: Diazepam Digoxin Diltiazem Erythromycin Grapefruit juice Haloperidol Medications for mental depression or mood problems Medications for seizures like carbamazepine, phenobarbital and phenytoin Nefazodone Other medications for anxiety Rifampin Ritonavir Some antifungal medications like itraconazole, ketoconazole, and voriconazole Verapamil Warfarin This list may not describe all possible interactions. Give your health care provider a list of all the medicines, herbs, non-prescription drugs, or dietary supplements you use. Also tell them if you smoke, drink alcohol, or use illegal drugs. Some items may interact with your  medicine. What should I watch for while using this medication? Visit your care team for regular checks on your progress. It may take 1 to 2 weeks before your anxiety gets better. You may get drowsy or dizzy. Do not drive, use machinery, or do anything that needs mental alertness until you know how this medication affects you. Do not stand or sit up quickly, especially if you are an older patient. This reduces the risk of dizzy or fainting spells. Alcohol can make you more drowsy and dizzy. Avoid alcoholic drinks. What side effects may I notice from receiving this medication? Side effects that you should report to your care team as soon as possible: Allergic reactions--skin rash, itching, hives, swelling of the face, lips, tongue, or throat Irritability, confusion, fast or irregular heartbeat, muscle stiffness, twitching muscles, sweating, high fever, seizure, chills, vomiting, diarrhea, which may be signs of serotonin syndrome Side  effects that usually do not require medical attention (report to your care team if they continue or are bothersome): Anxiety or nervousness Dizziness Drowsiness Headache Nausea Trouble sleeping This list may not describe all possible side effects. Call your doctor for medical advice about side effects. You may report side effects to FDA at 1-800-FDA-1088. Where should I keep my medication? Keep out of the reach of children. Store at room temperature below 30 degrees C (86 degrees F). Protect from light. Keep container tightly closed. Throw away any unused medication after the expiration date. NOTE: This sheet is a summary. It may not cover all possible information. If you have questions about this medicine, talk to your doctor, pharmacist, or health care provider.  2023 Elsevier/Gold Standard (2020-06-07 00:00:00)

## 2021-08-22 LAB — COMPLETE METABOLIC PANEL WITH GFR
AG Ratio: 1.6 (calc) (ref 1.0–2.5)
ALT: 15 U/L (ref 6–29)
AST: 23 U/L (ref 10–30)
Albumin: 4.4 g/dL (ref 3.6–5.1)
Alkaline phosphatase (APISO): 35 U/L (ref 31–125)
BUN: 20 mg/dL (ref 7–25)
CO2: 25 mmol/L (ref 20–32)
Calcium: 9.8 mg/dL (ref 8.6–10.2)
Chloride: 104 mmol/L (ref 98–110)
Creat: 0.87 mg/dL (ref 0.50–0.99)
Globulin: 2.7 g/dL (calc) (ref 1.9–3.7)
Glucose, Bld: 103 mg/dL — ABNORMAL HIGH (ref 65–99)
Potassium: 4.2 mmol/L (ref 3.5–5.3)
Sodium: 138 mmol/L (ref 135–146)
Total Bilirubin: 0.3 mg/dL (ref 0.2–1.2)
Total Protein: 7.1 g/dL (ref 6.1–8.1)
eGFR: 85 mL/min/{1.73_m2} (ref >=60)

## 2021-08-22 LAB — LIPID PANEL
Cholesterol: 208 mg/dL — ABNORMAL HIGH (ref <200)
HDL: 59 mg/dL (ref >=50)
LDL Cholesterol (Calc): 124 mg/dL (calc) — ABNORMAL HIGH
Non-HDL Cholesterol (Calc): 149 mg/dL (calc) — ABNORMAL HIGH (ref <130)
Total CHOL/HDL Ratio: 3.5 (calc) (ref <5.0)
Triglycerides: 134 mg/dL (ref <150)

## 2021-08-22 LAB — CBC WITH DIFFERENTIAL/PLATELET
Absolute Monocytes: 421 cells/uL (ref 200–950)
Basophils Absolute: 32 cells/uL (ref 0–200)
Basophils Relative: 0.6 %
Eosinophils Absolute: 49 cells/uL (ref 15–500)
Eosinophils Relative: 0.9 %
HCT: 37.2 % (ref 35.0–45.0)
Hemoglobin: 13 g/dL (ref 11.7–15.5)
Lymphs Abs: 2084 cells/uL (ref 850–3900)
MCH: 33.7 pg — ABNORMAL HIGH (ref 27.0–33.0)
MCHC: 34.9 g/dL (ref 32.0–36.0)
MCV: 96.4 fL (ref 80.0–100.0)
MPV: 10.3 fL (ref 7.5–12.5)
Monocytes Relative: 7.8 %
Neutro Abs: 2813 cells/uL (ref 1500–7800)
Neutrophils Relative %: 52.1 %
Platelets: 251 10*3/uL (ref 140–400)
RBC: 3.86 10*6/uL (ref 3.80–5.10)
RDW: 12.5 % (ref 11.0–15.0)
Total Lymphocyte: 38.6 %
WBC: 5.4 10*3/uL (ref 3.8–10.8)

## 2021-08-22 LAB — HSV(HERPES SIMPLEX VRS) I + II AB-IGG
HAV 1 IGG,TYPE SPECIFIC AB: 18.9 index — ABNORMAL HIGH
HSV 2 IGG,TYPE SPECIFIC AB: <0.9 index

## 2021-08-22 LAB — RPR: RPR Ser Ql: NONREACTIVE

## 2021-08-22 LAB — URINALYSIS, ROUTINE W REFLEX MICROSCOPIC
Bilirubin Urine: NEGATIVE
Glucose, UA: NEGATIVE
Hgb urine dipstick: NEGATIVE
Ketones, ur: NEGATIVE
Leukocytes,Ua: NEGATIVE
Nitrite: NEGATIVE
Protein, ur: NEGATIVE
Specific Gravity, Urine: 1.006 (ref 1.001–1.035)
pH: 7.5 (ref 5.0–8.0)

## 2021-08-22 LAB — C. TRACHOMATIS/N. GONORRHOEAE RNA
C. trachomatis RNA, TMA: NOT DETECTED
N. gonorrhoeae RNA, TMA: NOT DETECTED

## 2021-08-22 LAB — TSH: TSH: 0.64 mIU/L

## 2021-08-22 LAB — HIV ANTIBODY (ROUTINE TESTING W REFLEX): HIV 1&2 Ab, 4th Generation: NONREACTIVE

## 2021-08-22 LAB — MICROALBUMIN / CREATININE URINE RATIO
Creatinine, Urine: 15 mg/dL — ABNORMAL LOW (ref 20–275)
Microalb, Ur: <0.2 mg/dL

## 2021-08-23 ENCOUNTER — Encounter: Payer: Self-pay | Admitting: Adult Health

## 2021-10-02 ENCOUNTER — Encounter: Payer: Self-pay | Admitting: Adult Health

## 2021-10-02 NOTE — Progress Notes (Signed)
Assessment and Plan:  Courtney Wheeler was seen today for medication management.  Diagnoses and all orders for this visit:  Bipolar 2 disorder (HCC) Risks/benefits of medication discussed; role of mood stabilizer in bipolar disorders; she would like to proceed with trial of lamictal Start as prescribed; monitor closely for any rash, stop immediately and contact office if any concerns Follow up here in 4-5 weeks or with Dr. Lolly Mustache for further management if gets in with him sooner than that.  -     Lamotrigine (LAMICTAL STARTER) 42 x 25 MG & 7 x 100 MG KIT; Take 25 mg daily for 2 weeks, 50 mg daily for 2 weeks, then 100 mg daily.  Alcohol consumption binge drinking Continue follow up with therapy; denies opioid use; discussed SE Follow up with office if any concerns -     naltrexone (DEPADE) 50 MG tablet; Take 1 tablet (50 mg total) by mouth daily.   Further disposition pending results of labs. Discussed med's effects and SE's.   Over 30 minutes of exam, counseling, chart review, and critical decision making was performed.   Future Appointments  Date Time Provider Department Center  11/19/2021  9:00 AM Arfeen, Phillips Grout, MD [facility] None  08/22/2022  9:00 AM Raynelle Dick, NP [facility] None    ------------------------------------------------------------------------------------------------------------------   HPI BP 100/70   Pulse 64   Temp (!) 97.5 F (36.4 C)   Ht 5' 4.5" (1.638 m)   Wt 140 lb (63.5 kg)   LMP 09/12/2021   SpO2 98%   BMI 23.66 kg/m  41 y.o.female presents for request to start lamictal and naltrexone.   Long hx of depression/anxiety, difficulty with sleep. 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 Hx of PTSD following ectopic pregnancy in 2013. Appaently has had sx since childhood. Has tried wellbutrin, buspirone without benefit. Preference to avoid daily med and was working with psychologist Sandria Bales since 10/2020. Patient does not believe she has ever tried SSRIs. Dr. Denton Lank advised patient and sent [place] communication recommending possible hypomania/bipolar disorder, recommended mood stabilizer/lamictal and also trial of naltrexone for intermittent alcohol binging (2 bottles of wine, 3 times a month).   Was recommended psych, pending evaluation by Dr. Kathryne Sharper late August 2023 and would like to start lamictal and naltrexone.   Past Medical History:  Diagnosis Date   Allergy    RHINITIS   Anemia    Blood transfusion    Ectopic pregnancy 03/2011   ruptured just prior to having abortion   Heart murmur    Prediabetes    Seizure disorder (HCC)    5th grade   Vitamin D deficiency      Allergies  Allergen Reactions   Penicillins Hives    Current Outpatient Medications on File Prior to Visit  Medication Sig   Multiple Vitamin (MULTIVITAMIN) tablet Take 1 tablet by mouth daily.   busPIRone (BUSPAR) 10 MG tablet Take 0.5-1 tablets (5-10 mg total) by mouth 3 (three) times daily as needed. (Patient not taking: Reported on 10/03/2021)   No current facility-administered medications on file prior to visit.   Allergies:  Allergies  Allergen Reactions   Penicillins Hives   Family History:  Herfamily history includes Arthritis in her paternal grandmother; Asthma in her paternal grandmother; Birth defects in her paternal grandmother; Breast cancer in her cousin and paternal grandmother; COPD in her maternal grandfather; Cancer in her paternal grandfather; Diabetes in her maternal grandmother; Heart  disease in her maternal uncle, mother, and paternal grandfather; Hypertension in her maternal grandmother, paternal grandfather, and paternal grandmother; Idiopathic pulmonary fibrosis in her maternal uncle; Mental illness in her maternal aunt; Other in her maternal aunt; Stroke in her father and paternal grandfather. Social History:   reports that she quit smoking about 13 months ago.  Her smoking use included e-cigarettes and cigarettes. She started smoking about 31 years ago. She has a 15.75 pack-year smoking history. She has never used smokeless tobacco. She reports that she does not currently use alcohol. She reports that she does not use drugs.   ROS: all negative except above.   Physical Exam:  BP 100/70   Pulse 64   Temp (!) 97.5 F (36.4 C)   Ht 5' 4.5" (1.638 m)   Wt 140 lb (63.5 kg)   LMP 09/12/2021   SpO2 98%   BMI 23.66 kg/m   General Appearance: Well nourished, well developed adult female in no apparent distress. Eyes: PERRL, conjunctiva no swelling or erythema ENT/Mouth: Hearing normal.  Neck: Supple, thyroid not enlarged Respiratory: Respiratory effort normal Cardio: appears well perfused  Lymphatics: Non tender without lymphadenopathy.  Musculoskeletal: no obvious deformity; normal gait.  Skin: Warm, dry without rashes, lesions, ecchymosis.  Neuro: Normal muscle tone Psych: Awake and oriented X 3, normal affect, Insight and Judgment appropriate.   Dan Maker, NP 12:05 PM [facility]

## 2021-10-03 ENCOUNTER — Telehealth: Payer: Self-pay | Admitting: Adult Health

## 2021-10-03 ENCOUNTER — Ambulatory Visit: Payer: 59 | Admitting: Adult Health

## 2021-10-03 ENCOUNTER — Encounter: Payer: Self-pay | Admitting: Adult Health

## 2021-10-03 VITALS — BP 100/70 | HR 64 | Temp 97.5°F | Ht 64.5 in | Wt 140.0 lb

## 2021-10-03 DIAGNOSIS — F3181 Bipolar II disorder: Secondary | ICD-10-CM | POA: Diagnosis not present

## 2021-10-03 DIAGNOSIS — F1090 Alcohol use, unspecified, uncomplicated: Secondary | ICD-10-CM

## 2021-10-03 DIAGNOSIS — F1010 Alcohol abuse, uncomplicated: Secondary | ICD-10-CM

## 2021-10-03 MED ORDER — NALTREXONE HCL 50 MG PO TABS: 50.0000 mg | ORAL_TABLET | Freq: Every day | ORAL | 0 refills | Status: DC

## 2021-10-03 MED ORDER — LAMICTAL STARTER 42 X 25 MG & 7 X 100 MG PO KIT: PACK | ORAL | 0 refills | Status: DC

## 2021-10-03 NOTE — Telephone Encounter (Signed)
Patient was seen in the office today by Morrie Sheldon and was prescribed some new medication. She states that she contacted the pharmacy and they are telling her that they are waiting on the Prior Authorization. I explained to her that the prior authorization is contingent on her insurance company and that it could take up to a week. She is going out of town and wants to know if we have done our part of the prior authorization.

## 2021-10-03 NOTE — Patient Instructions (Signed)
Lamotrigine Tablets What is this medication? LAMOTRIGINE (la MOE tri jeen) prevents and controls seizures in people with epilepsy. It may also be used to treat bipolar disorder. It works by calming overactive nerves in your body. This medicine may be used for other purposes; ask your health care provider or pharmacist if you have questions. COMMON BRAND NAME(S): Lamictal, Subvenite What should I tell my care team before I take this medication? They need to know if you have any of these conditions: Heart disease History of irregular heartbeat Immune system problems Kidney disease Liver disease Low levels of folic acid in the blood Lupus Mental illness Suicidal thoughts, plans, or attempt; a previous suicide attempt by you or a family member An unusual or allergic reaction to lamotrigine or other seizure medications, other medications, foods, dyes, or preservatives Pregnant or trying to get pregnant Breast-feeding How should I use this medication? Take this medication by mouth with a glass of water. Follow the directions on the prescription label. Do not chew these tablets. If this medication upsets your stomach, take it with food or milk. Take your doses at regular intervals. Do not take your medication more often than directed. A special MedGuide will be given to you by the pharmacist with each new prescription and refill. Be sure to read this information carefully each time. Talk to your care team about the use of this medication in children. While this medication may be prescribed for children as young as 2 years for selected conditions, precautions do apply. Overdosage: If you think you have taken too much of this medicine contact a poison control center or emergency room at once. NOTE: This medicine is only for you. Do not share this medicine with others. What if I miss a dose? If you miss a dose, take it as soon as you can. If it is almost time for your next dose, take only that dose.  Do not take double or extra doses. What may interact with this medication? Atazanavir Birth control pills Certain medications for irregular heartbeat Certain medications for seizures like carbamazepine, phenobarbital, phenytoin, primidone, valproic acid Lopinavir Rifampin Ritonavir This list may not describe all possible interactions. Give your health care provider a list of all the medicines, herbs, non-prescription drugs, or dietary supplements you use. Also tell them if you smoke, drink alcohol, or use illegal drugs. Some items may interact with your medicine. What should I watch for while using this medication? Visit your care team for regular checks on your progress. If you take this medication for seizures, wear a Medic Alert bracelet or necklace. Carry an identification card with information about your condition, medications, and care team. It is important to take this medication exactly as directed. When first starting treatment, your dose will need to be adjusted slowly. It may take weeks or months before your dose is stable. You should contact your care team if your seizures get worse or if you have any new types of seizures. Do not stop taking this medication unless instructed by your care team. Stopping your medication suddenly can increase your seizures or their severity. This medication may cause serious skin reactions. They can happen weeks to months after starting the medication. Contact your care team right away if you notice fevers or flu-like symptoms with a rash. The rash may be red or purple and then turn into blisters or peeling of the skin. Or, you might notice a red rash with swelling of the face, lips or lymph nodes in your   neck or under your arms. You may get drowsy, dizzy, or have blurred vision. Do not drive, use machinery, or do anything that needs mental alertness until you know how this medication affects you. To reduce dizzy or fainting spells, do not sit or stand up  quickly, especially if you are an older patient. Alcohol can increase drowsiness and dizziness. Avoid alcoholic drinks. If you are taking this medication for bipolar disorder, it is important to report any changes in your mood to your care team. If your condition gets worse, you get mentally depressed, feel very hyperactive or manic, have difficulty sleeping, or have thoughts of hurting yourself or committing suicide, you need to get help from your care team right away. If you are a caregiver for someone taking this medication for bipolar disorder, you should also report these behavioral changes right away. The use of this medication may increase the chance of suicidal thoughts or actions. Pay special attention to how you are responding while on this medication. Your mouth may get dry. Chewing sugarless gum or sucking hard candy, and drinking plenty of water may help. Contact your care team if the problem does not go away or is severe. Women who become pregnant while using this medication may enroll in the [place] Antiepileptic Drug Pregnancy Registry by calling 1-888-233-2334. This registry collects information about the safety of antiepileptic medication use during pregnancy. This medication may cause a decrease in folic acid. You should make sure that you get enough folic acid while you are taking this medication. Discuss the foods you eat and the vitamins you take with your care team. What side effects may I notice from receiving this medication? Side effects that you should report to your care team as soon as possible: Allergic reactions--skin rash, itching, hives, swelling of the face, lips, tongue, or throat Change in vision Fever, neck pain or stiffness, sensitivity to light, headache, nausea, vomiting, confusion Heart rhythm changes--fast or irregular heartbeat, dizziness, feeling faint or lightheaded, chest pain, trouble breathing Infection--fever, chills, cough, or sore throat Liver  injury--right upper belly pain, loss of appetite, nausea, light-colored stool, dark yellow or brown urine, yellowing skin or eyes, unusual weakness or fatigue Low red blood cell count--unusual weakness or fatigue, dizziness, headache, trouble breathing Rash, fever, and swollen lymph nodes Redness, blistering, peeling or loosening of the skin, including inside the mouth Thoughts of suicide or self-harm, worsening mood, or feelings of depression Unusual bruising or bleeding Side effects that usually do not require medical attention (report to your care team if they continue or are bothersome): Diarrhea Dizziness Drowsiness Headache Nausea Stomach pain Tremors or shaking This list may not describe all possible side effects. Call your doctor for medical advice about side effects. You may report side effects to FDA at 1-800-FDA-1088. Where should I keep my medication? Keep out of the reach of children and pets. Store at 25 degrees C (77 degrees F). Protect from light. Get rid of any unused medication after the expiration date. To get rid of medications that are no longer needed or have expired: Take the medication to a medication take-back program. Check with your pharmacy or law enforcement to find a location. If you cannot return the medication, check the label or package insert to see if the medication should be thrown out in the garbage or flushed down the toilet. If you are not sure, ask your care team. If it is safe to put it in the trash, empty the medication out of the   container. Mix the medication with cat litter, dirt, coffee grounds, or other unwanted substance. Seal the mixture in a bag or container. Put it in the trash. NOTE: This sheet is a summary. It may not cover all possible information. If you have questions about this medicine, talk to your doctor, pharmacist, or health care provider.  2023 Elsevier/Gold Standard (2021-02-08 00:00:00)  

## 2021-10-04 ENCOUNTER — Other Ambulatory Visit: Payer: Self-pay | Admitting: Adult Health

## 2021-10-04 ENCOUNTER — Encounter: Payer: Self-pay | Admitting: Adult Health

## 2021-10-04 MED ORDER — LAMOTRIGINE 25 MG PO TABS: ORAL_TABLET | ORAL | 0 refills | Status: DC

## 2021-10-04 MED ORDER — LAMOTRIGINE 100 MG PO TABS: ORAL_TABLET | ORAL | 0 refills | Status: DC

## 2021-10-04 NOTE — Telephone Encounter (Signed)
New prescription sent in.

## 2021-10-29 NOTE — Progress Notes (Unsigned)
Assessment and Plan:  There are no diagnoses linked to this encounter.  1. Bipolar 2 disorder (HCC) Continue Lamictal 100 mg daily. Risks/benefits of medication discussed. Continue to monitor closely for any rash, stop immediately and contact office if any concerns. Report to ER or call 911 for any increase in HI/SI.  - phentermine (ADIPEX-P) 37.5 MG tablet; Take 1/2 to 1 tab (18.75 mg - 37.5 mg) QAM.  Dispense: 30 tablet; Refill: 0 - lamoTRIgine (LAMICTAL) 100 MG tablet; Take 1 tab (100 mg) daily.  Dispense: 90 tablet; Refill: 0  2. Alcohol consumption binge drinking Continue Naltrexone. Currently effective. Continue to monitor   3. Anxiety Will add low dose Phentermine (18.75 mg 1/2 tab) to help aide with 20 lb weight gain and aide in focus for completion of multiple projects/decrease anxiety. Not for long term use.  Discussed SE including insomnia.   Stay well hydrated. Stop if you feel any CP, heart palpitations.  - phentermine (ADIPEX-P) 37.5 MG tablet; Take 1/2 to 1 tab (18.75 mg - 37.5 mg) QAM.  Dispense: 30 tablet; Refill: 0  4. BMI 23.0-23.9, adult Continue lifestyle modifications. Recommended diet heavy in fruits and veggies, omega 3's. Decrease consumption of animal meats, cheeses, and dairy products. Remain active and exercise as tolerated. Continue to monitor.  - phentermine (ADIPEX-P) 37.5 MG tablet; Take 1/2 to 1 tab (18.75 mg - 37.5 mg) QAM.  Dispense: 30 tablet; Refill: 0  5. Medication management All medications discussed and reviewed in full. All questions and concerns regarding medications addressed.    - phentermine (ADIPEX-P) 37.5 MG tablet; Take 1/2 to 1 tab (18.75 mg - 37.5 mg) QAM.  Dispense: 30 tablet; Refill: 0 - lamoTRIgine (LAMICTAL) 100 MG tablet; Take 1 tab (100 mg) daily.  Dispense: 90 tablet; Refill: 0    Further disposition pending results of labs. Discussed med's effects and SE's.   Over 20 minutes of exam, counseling, chart review,  and critical decision making was performed.   Future Appointments  Date Time Provider [place]  11/19/2021  9:00 AM Arfeen, Arlyce Harman, MD [facility] None  08/22/2022  9:00 AM Alycia Rossetti, NP [facility] None    ------------------------------------------------------------------------------------------------------------------   HPI  41 y.o.female presents for evaluation of new medication Lamictal and Naltrexone over the last month.    Long hx of depression/anxiety. 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 Hx of PTSD following ectopic pregnancy in 2013. Appaently has had sx since childhood. Has tried wellbutrin, buspirone without benefit.   Her original treatment preference included avoiding a daily med and was working with psychologist remote out of [place], [place] since 10/2020. She still continues to follow weekly.  Patient reported that she cannot take SSRI.   Dr. Arbutus Ped advised recommend PCP treat for possible hypomania/bipolar disorder, recommended mood stabilizer/lamictal and also trial of naltrexone for intermittent alcohol binging (2 bottles of wine, 3 times a month).    Was recommended psych, pending evaluation by Dr. Berniece Andreas late August 2023 and would like to start lamictal and naltrexone.   She reports now taking Lamictal 50 mg and was instructed to increase her dose to 100 mg tomorrow.  She feels as though she can continue the taper to the higher dose.  She denies any  side effects of the medication including rash, extreme mood swings, HI/SI.  She is unable to tell a difference being on this medication.    She continues to feel anxious  with multiple projects to complete including work and home.  She is not taking Buspar.  Reports good quality sleep.  Friends tell her she "sleeps like a baby."    She is trying to stay well active by running with her dog. She eats a well balanced diet and  enjoys cooking for herself.  She is concerned for a 20 lb weight gain over the last few months.  States that her original and most healthy weight where she feels the best is around 120 lb.    Feels as though the Naltrexone is working. She feels a difference more with this medication than she does the Lamictal. She is no longer drinking as heavily.    She does not wish to continue a follow up with Dr. Kathryne Sharper at this time.  She will continue weekly therapy meetings with psychologist.  Past Medical History:  Diagnosis Date   Allergy    RHINITIS   Anemia    Blood transfusion    Ectopic pregnancy 03/2011   ruptured just prior to having abortion   Heart murmur    Prediabetes    Seizure disorder (HCC)    5th grade   Vitamin D deficiency      Allergies  Allergen Reactions   Penicillins Hives    Current Outpatient Medications on File Prior to Visit  Medication Sig   Multiple Vitamin (MULTIVITAMIN) tablet Take 1 tablet by mouth daily.   naltrexone (DEPADE) 50 MG tablet Take 1 tablet (50 mg total) by mouth daily.   busPIRone (BUSPAR) 10 MG tablet Take 0.5-1 tablets (5-10 mg total) by mouth 3 (three) times daily as needed. (Patient not taking: Reported on 10/03/2021)   No current facility-administered medications on file prior to visit.   Current Outpatient Medications on File Prior to Visit  Medication Sig Dispense Refill   Multiple Vitamin (MULTIVITAMIN) tablet Take 1 tablet by mouth daily.     naltrexone (DEPADE) 50 MG tablet Take 1 tablet (50 mg total) by mouth daily. 90 tablet 0   busPIRone (BUSPAR) 10 MG tablet Take 0.5-1 tablets (5-10 mg total) by mouth 3 (three) times daily as needed. (Patient not taking: Reported on 10/03/2021) 90 tablet 0   No current facility-administered medications on file prior to visit.     ROS: all negative except above.   Physical Exam:  Today's Vitals   10/30/21 0911  BP: 112/68  Pulse: 66  Temp: 97.7 F (36.5 C)  SpO2: 99%  Weight: 138  lb (62.6 kg)  Height: 5' 4.5" (1.638 m)   Body mass index is 23.32 kg/m.  BMI is Body mass index is 23.32 kg/m., she has been working on diet and exercise. Wt Readings from Last 3 Encounters:  10/30/21 138 lb (62.6 kg)  10/03/21 140 lb (63.5 kg)  08/21/21 141 lb (64 kg)   LMP 09/12/2021   General Appearance: Well nourished, in no apparent distress. Eyes: PERRLA, EOMs, conjunctiva no swelling or erythema Sinuses: No Frontal/maxillary tenderness ENT/Mouth: Ext aud canals clear, TMs without erythema, bulging. No erythema, swelling, or exudate on post pharynx.  Tonsils not swollen or erythematous. Hearing normal.  Neck: Supple, thyroid normal.  Respiratory: Respiratory effort normal, BS equal bilaterally without rales, rhonchi, wheezing or stridor.  Cardio: RRR with no MRGs. Brisk peripheral pulses without edema.  Abdomen: Soft, + BS.  Non tender, no guarding, rebound, hernias, masses. Lymphatics: Non tender without lymphadenopathy.  Musculoskeletal: Full ROM, 5/5 strength, normal gait.  Skin: Warm, dry without rashes, lesions,  ecchymosis.  Neuro: Cranial nerves intact. Normal muscle tone, no cerebellar symptoms. Sensation intact.  Psych: Awake and oriented X 3, normal affect, Insight and Judgment appropriate.     Adela Glimpse, NP 10:36 AM [facility]

## 2021-10-30 ENCOUNTER — Ambulatory Visit: Payer: 59 | Admitting: Nurse Practitioner

## 2021-10-30 ENCOUNTER — Encounter: Payer: Self-pay | Admitting: Nurse Practitioner

## 2021-10-30 VITALS — BP 112/68 | HR 66 | Temp 97.7°F | Ht 64.5 in | Wt 138.0 lb

## 2021-10-30 DIAGNOSIS — Z6823 Body mass index (BMI) 23.0-23.9, adult: Secondary | ICD-10-CM | POA: Diagnosis not present

## 2021-10-30 DIAGNOSIS — F419 Anxiety disorder, unspecified: Secondary | ICD-10-CM

## 2021-10-30 DIAGNOSIS — F3181 Bipolar II disorder: Secondary | ICD-10-CM | POA: Diagnosis not present

## 2021-10-30 DIAGNOSIS — F1010 Alcohol abuse, uncomplicated: Secondary | ICD-10-CM | POA: Diagnosis not present

## 2021-10-30 DIAGNOSIS — Z79899 Other long term (current) drug therapy: Secondary | ICD-10-CM

## 2021-10-30 MED ORDER — LAMOTRIGINE 100 MG PO TABS: ORAL_TABLET | ORAL | 0 refills | Status: DC

## 2021-10-30 MED ORDER — PHENTERMINE HCL 37.5 MG PO TABS: ORAL_TABLET | ORAL | 0 refills | Status: DC

## 2021-11-19 ENCOUNTER — Ambulatory Visit (HOSPITAL_COMMUNITY): Payer: Self-pay | Admitting: Psychiatry

## 2021-11-22 ENCOUNTER — Encounter: Payer: Self-pay | Admitting: Gastroenterology

## 2021-11-26 ENCOUNTER — Telehealth: Payer: Self-pay | Admitting: Nurse Practitioner

## 2021-11-26 NOTE — Telephone Encounter (Signed)
Patient is requesting refills on Phentermine, Naltrexone, and Lamictal to [facility] on [facility].

## 2021-11-29 ENCOUNTER — Other Ambulatory Visit: Payer: Self-pay | Admitting: Nurse Practitioner

## 2021-11-29 ENCOUNTER — Encounter: Payer: Self-pay | Admitting: Nurse Practitioner

## 2021-11-29 DIAGNOSIS — Z79899 Other long term (current) drug therapy: Secondary | ICD-10-CM

## 2021-11-29 DIAGNOSIS — F3181 Bipolar II disorder: Secondary | ICD-10-CM

## 2021-11-29 DIAGNOSIS — Z6823 Body mass index (BMI) 23.0-23.9, adult: Secondary | ICD-10-CM

## 2021-11-29 DIAGNOSIS — F419 Anxiety disorder, unspecified: Secondary | ICD-10-CM

## 2021-11-29 DIAGNOSIS — F1010 Alcohol abuse, uncomplicated: Secondary | ICD-10-CM

## 2021-11-29 MED ORDER — PHENTERMINE HCL 37.5 MG PO TABS: ORAL_TABLET | ORAL | 0 refills | Status: DC

## 2021-12-02 ENCOUNTER — Other Ambulatory Visit: Payer: Self-pay

## 2021-12-02 DIAGNOSIS — F1010 Alcohol abuse, uncomplicated: Secondary | ICD-10-CM

## 2021-12-02 MED ORDER — NALTREXONE HCL 50 MG PO TABS: 50.0000 mg | ORAL_TABLET | Freq: Every day | ORAL | 0 refills | Status: DC

## 2021-12-26 ENCOUNTER — Ambulatory Visit: Payer: 59 | Admitting: Nurse Practitioner

## 2021-12-26 VITALS — Wt 129.0 lb

## 2021-12-26 DIAGNOSIS — R600 Localized edema: Secondary | ICD-10-CM | POA: Diagnosis not present

## 2021-12-26 DIAGNOSIS — S01331A Puncture wound without foreign body of right ear, initial encounter: Secondary | ICD-10-CM

## 2021-12-26 NOTE — Patient Instructions (Signed)
Tee Tree Oil and Sea Salt Paste - Apply for two weeks.

## 2021-12-26 NOTE — Progress Notes (Signed)
Assessment and Plan:  Courtney Wheeler was seen today for a episodic visit.  Diagnoses and all order for this visit:  1. Complication of right ear piercing, initial encounter Apply OTC Hydrocortisone cream. May mix tea tree oil and sea salt as paste an apply Continue to monitor for any increase in infection including redness, pain, swelling.  2. Localized edema Continue to monitor Ice PRN  Notify office for further evaluation and treatment, questions or concerns if s/s fail to improve. The risks and benefits of my recommendations, as well as other treatment options were discussed with the patient today. Questions were answered.  Further disposition pending results of labs. Discussed med's effects and SE's.    Over 15 minutes of exam, counseling, chart review, and critical decision making was performed.   Future Appointments  Date Time Provider [place]  01/08/2022  8:30 AM Daryel November, MD [facility]  08/22/2022  9:00 AM Alycia Rossetti, NP [facility] None    ------------------------------------------------------------------------------------------------------------------   HPI Wt 129 lb (58.5 kg)   BMI 21.80 kg/m   41 y.o.female presents for evaluation of edema and redness to tragus after having piercing approximately 8 weeks ago.  She has continued to notice swelling and redness of the area.  Painful to touch.  She has tried topical cleansing solutions without effectiveness.  She denies streaking, hearing changes, N/V, fever.  Past Medical History:  Diagnosis Date   Allergy    RHINITIS   Anemia    Blood transfusion    Ectopic pregnancy 03/2011   ruptured just prior to having abortion   Heart murmur    Prediabetes    Seizure disorder (HCC)    5th grade   Vitamin D deficiency      Allergies  Allergen Reactions   Penicillins Hives    Current Outpatient Medications on File Prior to Visit  Medication Sig   busPIRone (BUSPAR) 10 MG  tablet Take 0.5-1 tablets (5-10 mg total) by mouth 3 (three) times daily as needed. (Patient not taking: Reported on 10/03/2021)   lamoTRIgine (LAMICTAL) 100 MG tablet Take 1 tab (100 mg) daily.   Multiple Vitamin (MULTIVITAMIN) tablet Take 1 tablet by mouth daily.   naltrexone (DEPADE) 50 MG tablet Take 1 tablet (50 mg total) by mouth daily.   phentermine (ADIPEX-P) 37.5 MG tablet Take 1 tab 37.5 mg QAM.   No current facility-administered medications on file prior to visit.    ROS: all negative except what is noted in the HPI.   Physical Exam:  Wt 129 lb (58.5 kg)   BMI 21.80 kg/m   General Appearance: NAD.  Awake, conversant and cooperative. Eyes: PERRLA, EOMs intact.  Sclera white.  Conjunctiva without erythema. Sinuses: No frontal/maxillary tenderness.  No nasal discharge. Nares patent.  ENT/Mouth: Ext aud canals clear.  Bilateral TMs w/DOL and without erythema or bulging. Hearing intact.  Posterior pharynx without swelling or exudate.  Tonsils without swelling or erythema.  Neck: Supple.  No masses, nodules or thyromegaly. Respiratory: Effort is regular with non-labored breathing. Breath sounds are equal bilaterally without rales, rhonchi, wheezing or stridor.  Cardio: RRR with no MRGs. Brisk peripheral pulses without edema.  Abdomen: Active BS in all four quadrants.  Soft and non-tender without guarding, rebound tenderness, hernias or masses. Lymphatics: Non tender without lymphadenopathy.  Musculoskeletal: Full ROM, 5/5 strength, normal ambulation.  No clubbing or cyanosis. Skin: Right tragus with moderate erythema to the tragus area, surround ear piercing jewelry.  No streaking.  Mild  erythema, tender to palpation.    Neuro: CN II-XII grossly normal. Normal muscle tone without cerebellar symptoms and intact sensation.   Psych: AO X 3,  appropriate mood and affect, insight and judgment.     Darrol Jump, NP 9:01 PM [facility]

## 2021-12-28 ENCOUNTER — Other Ambulatory Visit: Payer: Self-pay | Admitting: Nurse Practitioner

## 2021-12-28 DIAGNOSIS — F419 Anxiety disorder, unspecified: Secondary | ICD-10-CM

## 2021-12-28 DIAGNOSIS — Z79899 Other long term (current) drug therapy: Secondary | ICD-10-CM

## 2021-12-28 DIAGNOSIS — Z6823 Body mass index (BMI) 23.0-23.9, adult: Secondary | ICD-10-CM

## 2021-12-28 DIAGNOSIS — F3181 Bipolar II disorder: Secondary | ICD-10-CM

## 2021-12-30 ENCOUNTER — Other Ambulatory Visit: Payer: Self-pay | Admitting: Nurse Practitioner

## 2021-12-30 DIAGNOSIS — F419 Anxiety disorder, unspecified: Secondary | ICD-10-CM

## 2021-12-30 DIAGNOSIS — Z79899 Other long term (current) drug therapy: Secondary | ICD-10-CM

## 2021-12-30 DIAGNOSIS — F3181 Bipolar II disorder: Secondary | ICD-10-CM

## 2021-12-30 DIAGNOSIS — Z6823 Body mass index (BMI) 23.0-23.9, adult: Secondary | ICD-10-CM

## 2021-12-30 MED ORDER — PHENTERMINE HCL 37.5 MG PO TABS: ORAL_TABLET | ORAL | 0 refills | Status: DC

## 2022-01-07 ENCOUNTER — Ambulatory Visit: Payer: 59 | Admitting: Gastroenterology

## 2022-01-08 ENCOUNTER — Ambulatory Visit: Payer: 59 | Admitting: Gastroenterology

## 2022-01-27 ENCOUNTER — Other Ambulatory Visit: Payer: Self-pay | Admitting: Nurse Practitioner

## 2022-01-27 DIAGNOSIS — F419 Anxiety disorder, unspecified: Secondary | ICD-10-CM

## 2022-01-27 DIAGNOSIS — Z79899 Other long term (current) drug therapy: Secondary | ICD-10-CM

## 2022-01-27 DIAGNOSIS — F3181 Bipolar II disorder: Secondary | ICD-10-CM

## 2022-01-27 DIAGNOSIS — Z6823 Body mass index (BMI) 23.0-23.9, adult: Secondary | ICD-10-CM

## 2022-01-27 MED ORDER — PHENTERMINE HCL 37.5 MG PO TABS: ORAL_TABLET | ORAL | 0 refills | Status: DC

## 2022-02-25 ENCOUNTER — Ambulatory Visit (INDEPENDENT_AMBULATORY_CARE_PROVIDER_SITE_OTHER): Payer: 59 | Admitting: Gastroenterology

## 2022-02-25 ENCOUNTER — Encounter: Payer: Self-pay | Admitting: Gastroenterology

## 2022-02-25 VITALS — BP 102/76 | HR 78 | Ht 64.5 in | Wt 125.0 lb

## 2022-02-25 DIAGNOSIS — Z83719 Family history of colon polyps, unspecified: Secondary | ICD-10-CM | POA: Diagnosis not present

## 2022-02-25 DIAGNOSIS — Z789 Other specified health status: Secondary | ICD-10-CM

## 2022-02-25 NOTE — Patient Instructions (Signed)
_______________________________________________________  If you are age 43 or older, your body mass index should be between 23-30. Your Body mass index is 21.12 kg/m. If this is out of the aforementioned range listed, please consider follow up with your Primary Care Provider.  If you are age 6 or younger, your body mass index should be between 19-25. Your Body mass index is 21.12 kg/m. If this is out of the aformentioned range listed, please consider follow up with your Primary Care Provider.    You will be contacted by [facility] in the next 2 days to arrange a Ultrasound.  The number on your caller ID will be [phone], please answer when they call.  If you have not heard from them in 2 days please call [phone] to schedule.     The [place] providers would like to encourage you to use [facility] to communicate with providers for non-urgent requests or questions.  Due to long hold times on the telephone, sending your provider a message by [facility] may be a faster and more efficient way to get a response.  Please allow 48 business hours for a response.  Please remember that this is for non-urgent requests.   It was a pleasure to see you today!  Thank you for trusting me with your gastrointestinal care!    Scott E.Tomasa Rand, MD

## 2022-02-25 NOTE — Progress Notes (Signed)
HPI : Courtney Wheeler is a very pleasant 41 year old female referred to [place] by Dr. Lucky Cowboy for consideration of early colon cancer screening.  She has no known family history of colon cancer, but both her parents have had polyps.  She was unaware of the details of her father's polyps, so she called him during our encounter today.  The patient had 6 polyps on his most recent colonoscopy, a mix of tubular adenomas and hyperplastic polyps.  He was recommended to repeat a colonoscopy in 3 years.  Her mother also had polyps, but apparently were not numerous or concerning. The patient denies any chronic GI symptoms.  She has regular bowel movements.  No problems with constipation, diarrhea, abdominal pain or blood in the stool. She has a small painless bump around her anus which has been present for many years.  It does not bother her and it has not changed in size.  No pain with the passage of stool.  No perianal itching or burning.  The patient takes naltrexone to help with alcohol cravings.  She has a history of heavy alcohol use, but the naltrexone has helped curb her consumption.  Previously she may drink a bottle of wine per night, whereas now she says a bottle of wine will typically last for a week. Her liver enzymes have been persistently normal.  She had a CT scan 10 years ago which showed a normal-appearing liver.  Past Medical History:  Diagnosis Date   Allergy    RHINITIS   Anal fissure    Anemia    Blood transfusion    Ectopic pregnancy 03/25/2011   ruptured just prior to having abortion   Heart murmur    Prediabetes    Seizure disorder (HCC)    5th grade   Vitamin D deficiency      Past Surgical History:  Procedure Laterality Date   DILATION AND CURETTAGE OF UTERUS  03/2011   abortion   LAPAROTOMY  04/17/2011   Procedure: EXPLORATORY LAPAROTOMY;  Surgeon: Serita Kyle, MD;  Location: [facility];  Service: Gynecology;;   SALPINGECTOMY Right 04/17/2011   Dr. Cherly Hensen    Family History  Problem Relation Age of Onset   Heart disease Mother    Stroke Father        ?, unsure, ophthalmic    Mental illness Maternal Aunt    Heart disease Maternal Uncle    Idiopathic pulmonary fibrosis Maternal Uncle    Diabetes Maternal Grandmother    Hypertension Maternal Grandmother    Arthritis Paternal Grandmother    Asthma Paternal Grandmother    Birth defects Paternal Grandmother    Hypertension Paternal Grandmother    Breast cancer Paternal Grandmother        never confirmed, had mastectomy   Cancer Paternal Grandfather        bladder   Heart disease Paternal Grandfather    Hypertension Paternal Grandfather    Stroke Paternal Grandfather    Breast cancer Cousin    COPD Maternal Grandfather    Other Maternal Aunt        amiloidosis    Social History   Tobacco Use   Smoking status: Former    Packs/day: 0.75    Years: 21.00    Total pack years: 15.75    Types: E-cigarettes, Cigarettes    Start date: 67    Quit date: 08/22/2020    Years since quitting: 1.5   Smokeless tobacco: Never  Vaping Use  Vaping Use: Former   Start date: 05/23/2011   Quit date: 08/22/2020   Devices: Jule  Substance Use Topics   Alcohol use: Yes    Comment: socially   Drug use: No   Current Outpatient Medications  Medication Sig Dispense Refill   lamoTRIgine (LAMICTAL) 100 MG tablet Take 1 tab (100 mg) daily. 90 tablet 0   Multiple Vitamin (MULTIVITAMIN) tablet Take 1 tablet by mouth daily.     naltrexone (DEPADE) 50 MG tablet Take 1 tablet (50 mg total) by mouth daily. 90 tablet 0   phentermine (ADIPEX-P) 37.5 MG tablet Take 1 tab 37.5 mg QAM. 30 tablet 0   busPIRone (BUSPAR) 10 MG tablet Take 0.5-1 tablets (5-10 mg total) by mouth 3 (three) times daily as needed. (Patient not taking: Reported on 10/03/2021) 90 tablet 0   No current facility-administered medications for this visit.   Allergies  Allergen Reactions   Penicillins Hives     Review of Systems: All  systems reviewed and negative except where noted in HPI.    No results found.  Physical Exam: BP 102/76   Pulse 78   Ht 5' 4.5" (1.638 m)   Wt 125 lb (56.7 kg)   BMI 21.12 kg/m  Constitutional: Pleasant,well-developed, Caucasian female in no acute distress. HEENT: Normocephalic and atraumatic. Conjunctivae are normal. No scleral icterus. Cardiovascular: Normal rate, regular rhythm.  Pulmonary/chest: Effort normal and breath sounds normal. No wheezing, rales or rhonchi. Abdominal: Soft, nondistended, nontender. Bowel sounds active throughout. There are no masses palpable. No hepatomegaly. Extremities: no edema Neurological: Alert and oriented to person place and time. Skin: Skin is warm and dry. No rashes noted. Psychiatric: Normal mood and affect. Behavior is normal.  CBC    Component Value Date/Time   WBC 5.4 08/21/2021 1056   RBC 3.86 08/21/2021 1056   HGB 13.0 08/21/2021 1056   HCT 37.2 08/21/2021 1056   PLT 251 08/21/2021 1056   MCV 96.4 08/21/2021 1056   MCH 33.7 (H) 08/21/2021 1056   MCHC 34.9 08/21/2021 1056   RDW 12.5 08/21/2021 1056   LYMPHSABS 2,084 08/21/2021 1056   MONOABS 492 07/21/2016 0940   EOSABS 49 08/21/2021 1056   BASOSABS 32 08/21/2021 1056    CMP     Component Value Date/Time   NA 138 08/21/2021 1056   K 4.2 08/21/2021 1056   CL 104 08/21/2021 1056   CO2 25 08/21/2021 1056   GLUCOSE 103 (H) 08/21/2021 1056   BUN 20 08/21/2021 1056   CREATININE 0.87 08/21/2021 1056   CALCIUM 9.8 08/21/2021 1056   PROT 7.1 08/21/2021 1056   ALBUMIN 4.2 07/21/2016 0940   AST 23 08/21/2021 1056   ALT 15 08/21/2021 1056   ALKPHOS 34 07/21/2016 0940   BILITOT 0.3 08/21/2021 1056   GFRNONAA 82 08/17/2020 1113   GFRAA 95 08/17/2020 1113     ASSESSMENT AND PLAN: 41 year old female with family history of polyps, but no family history colon cancer.  It does not appear that her father had a tremendous polyp burden or any polyps with high-grade dysplasia given  his 3-year interval recommendation.  He also states that he was not recommended to have his children screened early. Given that precancerous polyps are extremely common (30 to 40% of the population will have polyps) it is not recommended that family members be screened early.  I would say that the patient is average risk for colon cancer screening I would recommend she proceed with a colonoscopy at age 56. Given  her history of heavy alcohol use and her small stature, she is at increased risk of alcohol related liver injury.  I would like to obtain a right upper quadrant ultrasound to look for steatosis.  If she has steatosis, I would recommend that she cut back her drinking further.  Family history of polyps - No known history of advanced polyps/significant polyp burden - Recommend screening at 54  History of heavy alcohol use - RUQUS  Lakrisha Iseman E. Tomasa Rand, MD [facility]   CC:  Lucky Cowboy, MD

## 2022-02-27 ENCOUNTER — Other Ambulatory Visit: Payer: Self-pay | Admitting: Nurse Practitioner

## 2022-02-27 DIAGNOSIS — Z79899 Other long term (current) drug therapy: Secondary | ICD-10-CM

## 2022-02-27 DIAGNOSIS — F3181 Bipolar II disorder: Secondary | ICD-10-CM

## 2022-02-27 DIAGNOSIS — Z6823 Body mass index (BMI) 23.0-23.9, adult: Secondary | ICD-10-CM

## 2022-02-27 DIAGNOSIS — F419 Anxiety disorder, unspecified: Secondary | ICD-10-CM

## 2022-02-28 ENCOUNTER — Other Ambulatory Visit: Payer: Self-pay | Admitting: Nurse Practitioner

## 2022-02-28 DIAGNOSIS — Z79899 Other long term (current) drug therapy: Secondary | ICD-10-CM

## 2022-02-28 DIAGNOSIS — F3181 Bipolar II disorder: Secondary | ICD-10-CM

## 2022-02-28 DIAGNOSIS — F419 Anxiety disorder, unspecified: Secondary | ICD-10-CM

## 2022-02-28 DIAGNOSIS — F1010 Alcohol abuse, uncomplicated: Secondary | ICD-10-CM

## 2022-02-28 DIAGNOSIS — Z6823 Body mass index (BMI) 23.0-23.9, adult: Secondary | ICD-10-CM

## 2022-03-01 MED ORDER — LAMOTRIGINE 100 MG PO TABS: ORAL_TABLET | ORAL | 0 refills | Status: DC

## 2022-03-01 MED ORDER — PHENTERMINE HCL 37.5 MG PO TABS: ORAL_TABLET | ORAL | 0 refills | Status: DC

## 2022-03-03 ENCOUNTER — Other Ambulatory Visit: Payer: Self-pay | Admitting: Nurse Practitioner

## 2022-03-03 DIAGNOSIS — Z79899 Other long term (current) drug therapy: Secondary | ICD-10-CM

## 2022-03-03 DIAGNOSIS — F3181 Bipolar II disorder: Secondary | ICD-10-CM

## 2022-03-03 MED ORDER — NALTREXONE HCL 50 MG PO TABS: 50.0000 mg | ORAL_TABLET | Freq: Every day | ORAL | 0 refills | Status: DC

## 2022-03-19 ENCOUNTER — Ambulatory Visit (HOSPITAL_COMMUNITY)
Admission: RE | Admit: 2022-03-19 | Discharge: 2022-03-19 | Disposition: A | Discharge status: Home / Self Care | Payer: 59 | Source: Ambulatory Visit | Attending: Gastroenterology | Admitting: Gastroenterology

## 2022-03-19 DIAGNOSIS — Z789 Other specified health status: Secondary | ICD-10-CM | POA: Diagnosis not present

## 2022-03-19 DIAGNOSIS — Z83719 Family history of colon polyps, unspecified: Secondary | ICD-10-CM | POA: Insufficient documentation

## 2022-03-20 NOTE — Progress Notes (Signed)
Courtney Wheeler,  Good news.  The ultrasound of your liver was normal.  There was no evidence of chronic liver damage from alcohol. Continue to drink alcohol in moderation.  Guidelines recommend no more than one drink per day for females.

## 2022-04-14 ENCOUNTER — Encounter: Payer: Self-pay | Admitting: Nurse Practitioner

## 2022-04-30 ENCOUNTER — Other Ambulatory Visit: Payer: Self-pay | Admitting: Nurse Practitioner

## 2022-04-30 DIAGNOSIS — Z79899 Other long term (current) drug therapy: Secondary | ICD-10-CM

## 2022-04-30 DIAGNOSIS — F419 Anxiety disorder, unspecified: Secondary | ICD-10-CM

## 2022-04-30 DIAGNOSIS — Z6823 Body mass index (BMI) 23.0-23.9, adult: Secondary | ICD-10-CM

## 2022-04-30 DIAGNOSIS — F3181 Bipolar II disorder: Secondary | ICD-10-CM

## 2022-04-30 DIAGNOSIS — F1010 Alcohol abuse, uncomplicated: Secondary | ICD-10-CM

## 2022-04-30 MED ORDER — NALTREXONE HCL 50 MG PO TABS: 50.0000 mg | ORAL_TABLET | Freq: Every day | ORAL | 0 refills | Status: DC

## 2022-04-30 MED ORDER — PHENTERMINE HCL 37.5 MG PO TABS: ORAL_TABLET | ORAL | 0 refills | Status: DC

## 2022-04-30 MED ORDER — LAMOTRIGINE 100 MG PO TABS: ORAL_TABLET | ORAL | 0 refills | Status: DC

## 2022-06-02 ENCOUNTER — Other Ambulatory Visit: Payer: Self-pay | Admitting: Nurse Practitioner

## 2022-06-02 DIAGNOSIS — F419 Anxiety disorder, unspecified: Secondary | ICD-10-CM

## 2022-06-02 DIAGNOSIS — F3181 Bipolar II disorder: Secondary | ICD-10-CM

## 2022-06-02 DIAGNOSIS — Z6823 Body mass index (BMI) 23.0-23.9, adult: Secondary | ICD-10-CM

## 2022-06-02 DIAGNOSIS — Z79899 Other long term (current) drug therapy: Secondary | ICD-10-CM

## 2022-06-02 DIAGNOSIS — F1010 Alcohol abuse, uncomplicated: Secondary | ICD-10-CM

## 2022-06-02 MED ORDER — PHENTERMINE HCL 37.5 MG PO TABS: ORAL_TABLET | ORAL | 0 refills | Status: DC

## 2022-06-02 MED ORDER — LAMOTRIGINE 100 MG PO TABS: ORAL_TABLET | ORAL | 0 refills | Status: DC

## 2022-06-02 MED ORDER — NALTREXONE HCL 50 MG PO TABS: 50.0000 mg | ORAL_TABLET | Freq: Every day | ORAL | 0 refills | Status: DC

## 2022-07-03 ENCOUNTER — Other Ambulatory Visit: Payer: Self-pay | Admitting: Nurse Practitioner

## 2022-07-03 DIAGNOSIS — F419 Anxiety disorder, unspecified: Secondary | ICD-10-CM

## 2022-07-03 DIAGNOSIS — Z79899 Other long term (current) drug therapy: Secondary | ICD-10-CM

## 2022-07-03 DIAGNOSIS — Z6823 Body mass index (BMI) 23.0-23.9, adult: Secondary | ICD-10-CM

## 2022-07-03 DIAGNOSIS — F3181 Bipolar II disorder: Secondary | ICD-10-CM

## 2022-07-04 ENCOUNTER — Encounter: Payer: Self-pay | Admitting: Nurse Practitioner

## 2022-07-04 ENCOUNTER — Other Ambulatory Visit: Payer: Self-pay | Admitting: Nurse Practitioner

## 2022-07-04 DIAGNOSIS — Z79899 Other long term (current) drug therapy: Secondary | ICD-10-CM

## 2022-07-04 DIAGNOSIS — Z6823 Body mass index (BMI) 23.0-23.9, adult: Secondary | ICD-10-CM

## 2022-07-04 DIAGNOSIS — F419 Anxiety disorder, unspecified: Secondary | ICD-10-CM

## 2022-07-04 DIAGNOSIS — F3181 Bipolar II disorder: Secondary | ICD-10-CM

## 2022-08-21 ENCOUNTER — Other Ambulatory Visit: Payer: Self-pay | Admitting: Nurse Practitioner

## 2022-08-21 DIAGNOSIS — F419 Anxiety disorder, unspecified: Secondary | ICD-10-CM

## 2022-08-21 DIAGNOSIS — Z6823 Body mass index (BMI) 23.0-23.9, adult: Secondary | ICD-10-CM

## 2022-08-21 DIAGNOSIS — F3181 Bipolar II disorder: Secondary | ICD-10-CM

## 2022-08-21 DIAGNOSIS — Z79899 Other long term (current) drug therapy: Secondary | ICD-10-CM

## 2022-08-22 ENCOUNTER — Encounter: Payer: 59 | Admitting: Nurse Practitioner

## 2022-08-25 ENCOUNTER — Encounter: Payer: Self-pay | Admitting: Nurse Practitioner

## 2022-08-25 ENCOUNTER — Ambulatory Visit (INDEPENDENT_AMBULATORY_CARE_PROVIDER_SITE_OTHER): Payer: 59 | Admitting: Nurse Practitioner

## 2022-08-25 VITALS — BP 102/68 | HR 66 | Temp 97.7°F | Ht 64.5 in | Wt 127.4 lb

## 2022-08-25 DIAGNOSIS — N939 Abnormal uterine and vaginal bleeding, unspecified: Secondary | ICD-10-CM

## 2022-08-25 DIAGNOSIS — Z0000 Encounter for general adult medical examination without abnormal findings: Secondary | ICD-10-CM

## 2022-08-25 DIAGNOSIS — Z1389 Encounter for screening for other disorder: Secondary | ICD-10-CM

## 2022-08-25 DIAGNOSIS — Z1329 Encounter for screening for other suspected endocrine disorder: Secondary | ICD-10-CM

## 2022-08-25 DIAGNOSIS — F439 Reaction to severe stress, unspecified: Secondary | ICD-10-CM

## 2022-08-25 DIAGNOSIS — I10 Essential (primary) hypertension: Secondary | ICD-10-CM

## 2022-08-25 DIAGNOSIS — E559 Vitamin D deficiency, unspecified: Secondary | ICD-10-CM

## 2022-08-25 DIAGNOSIS — F419 Anxiety disorder, unspecified: Secondary | ICD-10-CM

## 2022-08-25 DIAGNOSIS — Z131 Encounter for screening for diabetes mellitus: Secondary | ICD-10-CM

## 2022-08-25 DIAGNOSIS — F902 Attention-deficit hyperactivity disorder, combined type: Secondary | ICD-10-CM

## 2022-08-25 DIAGNOSIS — Z0001 Encounter for general adult medical examination with abnormal findings: Secondary | ICD-10-CM

## 2022-08-25 DIAGNOSIS — R040 Epistaxis: Secondary | ICD-10-CM | POA: Insufficient documentation

## 2022-08-25 DIAGNOSIS — Z130 Encounter for screening for diseases of the blood and blood-forming organs and certain disorders involving the immune mechanism: Secondary | ICD-10-CM

## 2022-08-25 DIAGNOSIS — Z79899 Other long term (current) drug therapy: Secondary | ICD-10-CM

## 2022-08-25 DIAGNOSIS — Z136 Encounter for screening for cardiovascular disorders: Secondary | ICD-10-CM

## 2022-08-25 DIAGNOSIS — E538 Deficiency of other specified B group vitamins: Secondary | ICD-10-CM

## 2022-08-25 DIAGNOSIS — E782 Mixed hyperlipidemia: Secondary | ICD-10-CM

## 2022-08-25 DIAGNOSIS — Z6823 Body mass index (BMI) 23.0-23.9, adult: Secondary | ICD-10-CM

## 2022-08-25 DIAGNOSIS — Z87891 Personal history of nicotine dependence: Secondary | ICD-10-CM

## 2022-08-25 MED ORDER — LISDEXAMFETAMINE DIMESYLATE 30 MG PO CAPS: 30.0000 mg | ORAL_CAPSULE | Freq: Every day | ORAL | 0 refills | Status: DC

## 2022-08-25 NOTE — Progress Notes (Signed)
Complete Physical  Assessment and Plan:  Encounter for Annual Physical Exam with abnormal findings Due annually  Health Maintenance reviewed Healthy lifestyle reviewed and goals set  Former Tobacco use/ E. Cigs Quit successfully 08/22/2020 Not meeting criteria for low dose CT Monitor for concerning sx  Vitamin D deficiency Continue supplement for goal of 60-100 Monitor Vitamin D levels  Hyperlipidemia Mild elevations managed by lifestyle  Discussed lifestyle modifications. Recommended diet heavy in fruits and veggies, omega 3's. Decrease consumption of animal meats, cheeses, and dairy products. Remain active and exercise as tolerated. Continue to monitor. Check lipids/TSH  Screening thyroid - TSH  Screening for blood or protein in urine - Urinalysis, Routine w reflex microscopic (not at [facility]) - Microalbumin/creatinine ratio  Medication management All medications discussed and reviewed in full. All questions and concerns regarding medications addressed.    BMI 23 Discontinue Phentermine Discussed appropriate BMI Diet modification. Physical activity. Encouraged/praised to build confidence.  High stress/anxiety Continue Lamictal Reviewed relaxation techniques.  Sleep hygiene. Cognitive Behavioral Therapy (CBT) PRN Recommended mindfulness meditation and exercise.   Encouraged personality growth wand development through coping techniques and problem-solving skills. Limit/Decrease/Monitor drug/alcohol intake.    Epistaxis  Stable and controlled Referral to ENT if s/s fail to improve  Vaginal bleeding Stable and controlled Secondary to hormonal fluctuations/perimenopause Continue to follow with GYN  ADHD ASRS - 1.1 reviewed and positive for ADHD DSM 5 Criteria reviewed  Start with lowest effective dose. Discussed alternative pharmacological and non-pharmacological therapies. No suspected aberrant drug-taking behaviors. Continue to monitor   Orders Placed  This Encounter  Procedures   CBC with Differential/Platelet   COMPLETE METABOLIC PANEL WITH GFR   Magnesium   VITAMIN D 25 Hydroxy (Vit-D Deficiency, Fractures)   Lipid panel   TSH   Hemoglobin A1c   Insulin, random   Urinalysis, Routine w reflex microscopic   Microalbumin / creatinine urine ratio   Vitamin B12   Iron, Total/Total Iron Binding Cap   EKG 12-Lead   Meds ordered this encounter  Medications   lisdexamfetamine (VYVANSE) 30 MG capsule    Sig: Take 1 capsule (30 mg total) by mouth daily.    Dispense:  30 capsule    Refill:  0    Order Specific Question:   Supervising Provider    Answer:   Lucky Cowboy [phone]     Future Appointments  Date Time Provider Department Center  08/25/2023  2:00 PM Adela Glimpse, NP [facility] None    HPI  42 y.o. female  presents for a complete physical. 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  She is single and happy.  Enjoys traveling.    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  Admits to staying up late to finish projects.  She has several projects around her home that she has started and not finished.    She has a history of ectopic pregnancy 2013 s/p fallopion tube removal, follows with Dr. Braxton Feathers annually. Has some PTSD, has seen some counselors.  She has been working with therapist from [place] virtually for stress/anxiety - not good fit, will consider finding a new provider. Interested in  PRN medication for anxiety, wants to avoid SSRI/daily agent for now.   She is a  former smoker, quit cold [place] 07/2020. Last CXR in 2016 was normal. Has gained some weight since quitting -   She has Raynauds in bil hands, neg autoimmune work up. Manages with lifestyle changes.   Reports newly following with spine/scoliosis center for back pain, disc disease, L5/S1 arthritic changes, starting injections soon.   She reports having heavy nose bleeds over the winder months.  Concerned for blood loss, this was also accompanied with vaginal bleeding, not felt to be related.  Episodes have resolved.  She follows with GYN regularly and is UTD with Pap smear.   BMI is Body mass index is 21.53 kg/m., she has been working on diet and exercise.  Has had sucsses with weight loss using Phentermine in the past.  Wt Readings from Last 3 Encounters:  08/25/22 127 lb 6.4 oz (57.8 kg)  02/25/22 125 lb (56.7 kg)  12/26/21 129 lb (58.5 kg)   Today their BP is BP: 102/68 She does workout, She denies chest pain, shortness of breath, dizziness.   She is not on cholesterol medication and denies myalgias. Her cholesterol is not at goal. The cholesterol last visit was:   Lab Results  Component Value Date   CHOL 208 (H) 08/21/2021   HDL 59 08/21/2021   LDLCALC 124 (H) 08/21/2021   TRIG 134 08/21/2021   CHOLHDL 3.5 08/21/2021   She has had elevated insulin in the past. Most recent A1C in the office was:  Lab Results  Component Value Date   HGBA1C 5.4 07/22/2017   Patient is regularly taking a Vitamin D supplement as multivitamin, hasn't changed dose -  Lab Results  Component Value Date   VD25OH 53 08/03/2019     Current Medications:  Current Outpatient Medications on File Prior to Visit  Medication Sig Dispense Refill   Cholecalciferol (VITAMIN D) 125 MCG (5000 UT) CAPS Take by mouth.     lamoTRIgine (LAMICTAL) 100 MG tablet Take 1 tab (100 mg) daily. 90 tablet 0   MAGNESIUM PO Take by mouth.     Multiple Vitamin (MULTIVITAMIN) tablet Take 1 tablet by mouth daily.     Multiple  Vitamins-Minerals (HAIR SKIN AND NAILS FORMULA PO) Take by mouth.     naltrexone (DEPADE) 50 MG tablet Take 1 tablet (50 mg total) by mouth daily. 90 tablet 0   phentermine (ADIPEX-P) 37.5 MG tablet TAKE ONE TABLET BY MOUTH EVERY MORNING 30 tablet 0   No current facility-administered medications on file prior to visit.   Health Maintenance:   Immunization History  Administered Date(s) Administered   Influenza,inj,Quad PF,6+ Mos 01/14/2017, 11/27/2017   Influenza-Unspecified 01/14/2017, 11/27/2017   PFIZER Comirnaty(Gray Top)Covid-19 Tri-Sucrose Vaccine 01/01/2021   PFIZER(Purple Top)SARS-COV-2 Vaccination 06/02/2019, 06/28/2019, 01/09/2020   Tdap 06/28/2012   Health Maintenance  Topic Date Due   MAMMOGRAM  08/24/2021   COVID-19 Vaccine (5 - 2023-24 season) 11/22/2021   DTaP/Tdap/Td (2 - Td or Tdap) 06/29/2022   INFLUENZA VACCINE  10/23/2022   PAP SMEAR-Modifier  08/25/2023   HIV Screening  Completed   HPV VACCINES  Aged Out   Hepatitis C Screening  Discontinued   Tetanus: 2014 HPV: hx of, never had vaccine   Pap: Dr. Billy Coast, had 08/24/2020 MGM:  Reports has scheduled next month.  DEXA: N/A  Colonoscopy: Due at age 25 EGD: N/A  Last eye: Dr. Lance Coon glasses/contacts, goes annually, has scheduled, left eye tumor/fleckle, monitored annually  and measured, unchanged in several years has new corrective surgery with banding. Last dental: 2023, goes q53m Last derm: 07/2021, follows annually  Patient Care Team: Adela Glimpse, NP as PCP - General (Nurse Practitioner) Judd Gaudier, NP as Nurse Practitioner (Nurse Practitioner)  Medical History:  Past Medical History:  Diagnosis Date   Allergy    RHINITIS   Anal fissure    Anemia    Blood transfusion    Ectopic pregnancy 03/25/2011   ruptured just prior to having abortion   Heart murmur    Prediabetes    Seizure disorder (HCC)    5th grade   Vitamin D deficiency    Allergies Allergies  Allergen Reactions    Penicillins Hives    SURGICAL HISTORY She  has a past surgical history that includes Dilation and curettage of uterus (03/2011); laparotomy (04/17/2011); and Salpingectomy (Right, 04/17/2011). FAMILY HISTORY Her family history includes Arthritis in her paternal grandmother; Asthma in her paternal grandmother; Birth defects in her paternal grandmother; Breast cancer in her cousin and paternal grandmother; COPD in her maternal grandfather; Cancer in her paternal grandfather; Diabetes in her maternal grandmother; Heart disease in her maternal uncle, mother, and paternal grandfather; Hypertension in her maternal grandmother, paternal grandfather, and paternal grandmother; Idiopathic pulmonary fibrosis in her maternal uncle; Mental illness in her maternal aunt; Other in her maternal aunt; Stroke in her father and paternal grandfather. SOCIAL HISTORY She  reports that she quit smoking about 2 years ago. Her smoking use included e-cigarettes and cigarettes. She started smoking about 32 years ago. She has a 15.75 pack-year smoking history. She has never used smokeless tobacco. She reports current alcohol use. She reports that she does not use drugs.  Review of Systems: Review of Systems  Constitutional:  Negative for malaise/fatigue and weight loss.  HENT:  Negative for hearing loss and tinnitus.   Eyes:  Negative for blurred vision and double vision.  Respiratory:  Negative for cough, shortness of breath and wheezing.   Cardiovascular:  Negative for chest pain, palpitations, orthopnea, claudication and leg swelling.  Gastrointestinal:  Negative for abdominal pain, blood in stool, constipation, diarrhea, heartburn, melena, nausea and vomiting.  Genitourinary: Negative.   Musculoskeletal:  Negative for joint pain and myalgias.  Skin:  Negative for rash.  Neurological:  Negative for dizziness, tingling, sensory change, weakness and headaches.  Endo/Heme/Allergies:  Negative for polydipsia.   Psychiatric/Behavioral:  Negative for depression and substance abuse. The patient is nervous/anxious.   All other systems reviewed and are negative.   Physical Exam: Estimated body mass index is 21.53 kg/m as calculated from the following:   Height as of this encounter: 5' 4.5" (1.638 m).   Weight as of this encounter: 127 lb 6.4 oz (57.8 kg). BP 102/68   Pulse 66   Temp 97.7 F (36.5 C)   Ht 5' 4.5" (1.638 m)   Wt 127 lb 6.4 oz (57.8 kg)   LMP 08/09/2022   SpO2 97%   BMI 21.53 kg/m  General Appearance: Well nourished/slim, well dressed adult female in no apparent distress.  Eyes: PERRLA, EOMs, conjunctiva no swelling or erythema Sinuses: No Frontal/maxillary tenderness  ENT/Mouth: Ext aud canals clear, normal light reflex with TMs without erythema, bulging. Good dentition. No erythema, swelling, or exudate on post pharynx. Tonsils not swollen or erythematous. Hearing normal.  Neck: Supple, thyroid normal. No bruits  Respiratory: Respiratory effort normal, BS equal bilaterally without rales, rhonchi, wheezing or stridor.  Cardio: RRR without murmurs, rubs or gallops. Brisk  peripheral pulses without edema.  Chest: symmetric, with normal excursions and percussion.  Breasts: defer to GYN Abdomen: Soft, nontender, no guarding, rebound, hernias, masses, or organomegaly.  Lymphatics: Non tender without lymphadenopathy.  Genitourinary: defer to GYN Musculoskeletal: Full ROM all peripheral extremities,5/5 strength, and normal gait.  Skin: Warm, dry without rashes, lesions, ecchymosis. Neuro: Cranial nerves intact, reflexes equal bilaterally. Normal muscle tone, no cerebellar symptoms. Sensation intact.  Psych: Awake and oriented X 3, normal affect, Insight and Judgment appropriate.   EKG:  SB    Londan Coplen, DNP, AGNP-C 2:19 PM [facility]

## 2022-08-25 NOTE — Patient Instructions (Addendum)
Attention Deficit Hyperactivity Disorder, Adult Attention deficit hyperactivity disorder (ADHD) is a mental health disorder that starts during childhood. For many people with ADHD, the disorder continues into the adult years. Treatment can help you manage your symptoms. There are three main types of ADHD: Inattentive. With this type, adults have difficulty paying attention. This may affect cognitive abilities. Hyperactive-impulsive. With this type, adults have a lot of energy and have difficulty controlling their behavior. Combination type. Some people may have symptoms of both types. What are the causes? The exact cause of ADHD is not known. Most experts believe a person's genes and environment possibly contribute to ADHD. What increases the risk? The following factors may make you more likely to develop this condition: Having a first-degree relative such as a parent, brother, or sister, with the condition. Being born before 37 weeks of pregnancy (prematurely) or at a low birth weight. Being born to a mother who smoked tobacco or drank alcohol during pregnancy. Having experienced a brain injury. Being exposed to lead or other toxins in the womb or early in life. What are the signs or symptoms? Symptoms of this condition depend on the type of ADHD. Symptoms of the inattentive type include: Difficulty paying attention or following instructions. Often making simple mistakes. Being disorganized. Avoiding tasks that require time and attention. Losing and forgetting things. Symptoms of the hyperactive-impulsive type include: Restlessness. Talking out of turn, interrupting others, or talking too much. Difficulty with: Sitting still. Feeling motivated. Relaxing. Waiting in line or waiting for a turn. People with the combination type have symptoms of both of the other types. In adults, this condition may lead to certain problems, such as: Keeping jobs. Performing tasks at work. Having  stable relationships. Being on time or keeping to a schedule. How is this diagnosed? This condition is diagnosed based on your current symptoms and your history of symptoms. The diagnosis can be made by a health care provider such as a primary care provider or a mental health care specialist. Your health care provider may use a symptom checklist or a behavior rating scale to evaluate your symptoms. Your health care provider may also want to talk with people who have observed your behaviors throughout your life. How is this treated? This condition can be treated with medicines and behavior therapy. Medicines may be the best option to reduce impulsive behaviors and improve attention. Your health care provider may recommend: Stimulant medicines. These are the most common medicines used for adult ADHD. They affect certain chemicals in the brain (neurotransmitters) and improve your ability to control your symptoms. A non-stimulant medicine. These medicines can also improve focus, attention, and impulsive behavior. It may take weeks to months to see the effects of this medicine. Counseling and behavioral management are also important for treating ADHD. Counseling is often used along with medicine. Your health care provider may suggest: Cognitive behavioral therapy (CBT). This type of therapy teaches you to replace negative thoughts and actions with positive thoughts and actions. When used as part of ADHD treatment, this therapy may also include: Coping strategies for organization, time management, impulse control, and stress reduction. Mindfulness and meditation training. Behavioral management. You may work with a coach who is specially trained to help people with ADHD manage and organize activities and function more effectively. Follow these instructions at home: Medicines  Take over-the-counter and prescription medicines only as told by your health care provider. Talk with your health care provider  about the possible side effects of your medicines and   how to manage them. Alcohol use Do not drink alcohol if: Your health care provider tells you not to drink. You are pregnant, may be pregnant, or are planning to become pregnant. If you drink alcohol: Limit how much you use to: 0-1 drink a day for women. 0-2 drinks a day for men. Know how much alcohol is in your drink. In the [place] one drink equals one 12 oz bottle of beer (355 mL), one 5 oz glass of wine (148 mL), or one 1 oz glass of hard liquor (44 mL). Lifestyle  Do not use illegal drugs. Get enough sleep. Eat a healthy diet. Exercise regularly. Exercise can help to reduce stress and anxiety. General instructions Learn as much as you can about adult ADHD, and work closely with your health care providers to find the treatments that work best for you. Follow the same schedule each day. Use reminder devices like notes, calendars, and phone apps to stay on time and organized. Keep all follow-up visits. Your health care provider will need to monitor your condition and adjust your treatment over time. Where to find more information A health care provider may be able to recommend resources that are available online or over the phone. You could start with: Attention Deficit Disorder Association (ADDA): add.org National Institute of Mental Health (NIMH): nimh.nih.gov Contact a health care provider if: Your symptoms continue to cause problems. You have side effects from your medicine, such as: Repeated muscle twitches, coughing, or speech outbursts. Sleep problems. Loss of appetite. Dizziness. Unusually fast heartbeat. Stomach pains. Headaches. You are struggling with anxiety, depression, or substance abuse. Get help right away if: You have a severe reaction to a medicine. This symptom may be an emergency. Get help right away. Call 911. Do not wait to see if the symptom will go away. Do not drive yourself to the hospital. Take  one of these steps if you feel like you may hurt yourself or others, or have thoughts about taking your own life: Go to your nearest emergency room. Call 911. Call the National Suicide Prevention Lifeline at 1-800-273-8255 or 988. This is open 24 hours a day Text the Crisis Text Line at 741741. Summary ADHD is a mental health disorder that starts during childhood and often continues into your adult years. The exact cause of ADHD is not known. Most experts believe genetics and environmental factors contribute to ADHD. There is no cure for ADHD, but treatment with medicine, cognitive behavioral therapy, or behavioral management can help you manage your condition. This information is not intended to replace advice given to you by your health care provider. Make sure you discuss any questions you have with your health care provider. Document Revised: 06/28/2021 Document Reviewed: 06/28/2021 Elsevier Patient Education  2024 Elsevier Inc.  

## 2022-08-26 ENCOUNTER — Encounter: Payer: Self-pay | Admitting: Nurse Practitioner

## 2022-08-26 LAB — COMPLETE METABOLIC PANEL WITH GFR
AG Ratio: 1.7 (calc) (ref 1.0–2.5)
ALT: 23 U/L (ref 6–29)
AST: 18 U/L (ref 10–30)
Albumin: 4.4 g/dL (ref 3.6–5.1)
Alkaline phosphatase (APISO): 48 U/L (ref 31–125)
BUN: 20 mg/dL (ref 7–25)
CO2: 33 mmol/L — ABNORMAL HIGH (ref 20–32)
Calcium: 9.4 mg/dL (ref 8.6–10.2)
Chloride: 99 mmol/L (ref 98–110)
Creat: 0.81 mg/dL (ref 0.50–0.99)
Globulin: 2.6 g/dL (calc) (ref 1.9–3.7)
Glucose, Bld: 102 mg/dL — ABNORMAL HIGH (ref 65–99)
Potassium: 4.2 mmol/L (ref 3.5–5.3)
Sodium: 141 mmol/L (ref 135–146)
Total Bilirubin: 0.4 mg/dL (ref 0.2–1.2)
Total Protein: 7 g/dL (ref 6.1–8.1)
eGFR: 92 mL/min/{1.73_m2} (ref >=60)

## 2022-08-26 LAB — CBC WITH DIFFERENTIAL/PLATELET
Absolute Monocytes: 662 cells/uL (ref 200–950)
Basophils Absolute: 38 cells/uL (ref 0–200)
Basophils Relative: 0.6 %
Eosinophils Absolute: 69 cells/uL (ref 15–500)
Eosinophils Relative: 1.1 %
HCT: 37.4 % (ref 35.0–45.0)
Hemoglobin: 12.8 g/dL (ref 11.7–15.5)
Lymphs Abs: 1670 cells/uL (ref 850–3900)
MCH: 31.8 pg (ref 27.0–33.0)
MCHC: 34.2 g/dL (ref 32.0–36.0)
MCV: 92.8 fL (ref 80.0–100.0)
MPV: 10 fL (ref 7.5–12.5)
Monocytes Relative: 10.5 %
Neutro Abs: 3862 cells/uL (ref 1500–7800)
Neutrophils Relative %: 61.3 %
Platelets: 172 10*3/uL (ref 140–400)
RBC: 4.03 10*6/uL (ref 3.80–5.10)
RDW: 12 % (ref 11.0–15.0)
Total Lymphocyte: 26.5 %
WBC: 6.3 10*3/uL (ref 3.8–10.8)

## 2022-08-26 LAB — TSH: TSH: 1.15 mIU/L

## 2022-08-26 LAB — LIPID PANEL
Cholesterol: 183 mg/dL (ref <200)
HDL: 53 mg/dL (ref >=50)
LDL Cholesterol (Calc): 102 mg/dL (calc) — ABNORMAL HIGH
Non-HDL Cholesterol (Calc): 130 mg/dL (calc) — ABNORMAL HIGH (ref <130)
Total CHOL/HDL Ratio: 3.5 (calc) (ref <5.0)
Triglycerides: 163 mg/dL — ABNORMAL HIGH (ref <150)

## 2022-08-26 LAB — VITAMIN B12: Vitamin B-12: 1007 pg/mL (ref 200–1100)

## 2022-08-26 LAB — URINALYSIS, ROUTINE W REFLEX MICROSCOPIC
Bilirubin Urine: NEGATIVE
Glucose, UA: NEGATIVE
Hgb urine dipstick: NEGATIVE
Ketones, ur: NEGATIVE
Leukocytes,Ua: NEGATIVE
Nitrite: NEGATIVE
Protein, ur: NEGATIVE
Specific Gravity, Urine: 1.019 (ref 1.001–1.035)
pH: 7 (ref 5.0–8.0)

## 2022-08-26 LAB — IRON, TOTAL/TOTAL IRON BINDING CAP
%SAT: 37 % (calc) (ref 16–45)
Iron: 139 ug/dL (ref 40–190)
TIBC: 378 mcg/dL (calc) (ref 250–450)

## 2022-08-26 LAB — MICROALBUMIN / CREATININE URINE RATIO
Creatinine, Urine: 78 mg/dL (ref 20–275)
Microalb, Ur: <0.2 mg/dL

## 2022-08-26 LAB — VITAMIN D 25 HYDROXY (VIT D DEFICIENCY, FRACTURES): Vit D, 25-Hydroxy: 118 ng/mL — ABNORMAL HIGH (ref 30–100)

## 2022-08-26 LAB — MAGNESIUM: Magnesium: 2 mg/dL (ref 1.5–2.5)

## 2022-08-26 LAB — HEMOGLOBIN A1C
Hgb A1c MFr Bld: 5.6 % of total Hgb (ref <5.7)
Mean Plasma Glucose: 114 mg/dL
eAG (mmol/L): 6.3 mmol/L

## 2022-08-26 LAB — INSULIN, RANDOM: Insulin: 5 u[IU]/mL

## 2022-08-28 ENCOUNTER — Other Ambulatory Visit: Payer: Self-pay | Admitting: Nurse Practitioner

## 2022-08-28 DIAGNOSIS — F902 Attention-deficit hyperactivity disorder, combined type: Secondary | ICD-10-CM

## 2022-08-28 MED ORDER — DEXTROAMPHETAMINE SULFATE ER 15 MG PO CP24
15.0000 mg | ORAL_CAPSULE | Freq: Every day | ORAL | 0 refills | Status: DC
Start: 2022-08-28 — End: 2022-08-31

## 2022-08-28 MED ORDER — LISDEXAMFETAMINE DIMESYLATE 30 MG PO CAPS
30.0000 mg | ORAL_CAPSULE | Freq: Every day | ORAL | 0 refills | Status: DC
Start: 2022-08-28 — End: 2022-08-28

## 2022-08-31 ENCOUNTER — Other Ambulatory Visit: Payer: Self-pay | Admitting: Nurse Practitioner

## 2022-08-31 DIAGNOSIS — F902 Attention-deficit hyperactivity disorder, combined type: Secondary | ICD-10-CM

## 2022-08-31 MED ORDER — DEXTROAMPHETAMINE SULFATE ER 15 MG PO CP24
15.0000 mg | ORAL_CAPSULE | Freq: Every day | ORAL | 0 refills | Status: DC
Start: 2022-08-31 — End: 2022-09-29

## 2022-09-24 LAB — HM MAMMOGRAPHY

## 2022-09-29 ENCOUNTER — Encounter: Payer: Self-pay | Admitting: Nurse Practitioner

## 2022-09-29 ENCOUNTER — Telehealth: Payer: Self-pay

## 2022-09-29 ENCOUNTER — Other Ambulatory Visit: Payer: Self-pay | Admitting: Nurse Practitioner

## 2022-09-29 DIAGNOSIS — F902 Attention-deficit hyperactivity disorder, combined type: Secondary | ICD-10-CM

## 2022-09-29 MED ORDER — DEXTROAMPHETAMINE SULFATE ER 15 MG PO CP24
15.0000 mg | ORAL_CAPSULE | Freq: Every day | ORAL | 0 refills | Status: DC
Start: 2022-09-29 — End: 2022-10-31

## 2022-09-29 NOTE — Telephone Encounter (Signed)
Refill request for Dextroamphetamine.

## 2022-10-31 ENCOUNTER — Other Ambulatory Visit: Payer: Self-pay | Admitting: Nurse Practitioner

## 2022-10-31 ENCOUNTER — Encounter: Payer: Self-pay | Admitting: Nurse Practitioner

## 2022-10-31 DIAGNOSIS — Z79899 Other long term (current) drug therapy: Secondary | ICD-10-CM

## 2022-10-31 DIAGNOSIS — F419 Anxiety disorder, unspecified: Secondary | ICD-10-CM

## 2022-10-31 DIAGNOSIS — F902 Attention-deficit hyperactivity disorder, combined type: Secondary | ICD-10-CM

## 2022-10-31 DIAGNOSIS — F1010 Alcohol abuse, uncomplicated: Secondary | ICD-10-CM

## 2022-10-31 DIAGNOSIS — F3181 Bipolar II disorder: Secondary | ICD-10-CM

## 2022-10-31 DIAGNOSIS — Z6823 Body mass index (BMI) 23.0-23.9, adult: Secondary | ICD-10-CM

## 2022-10-31 MED ORDER — LAMOTRIGINE 100 MG PO TABS
ORAL_TABLET | ORAL | 0 refills | Status: DC
Start: 2022-10-31 — End: 2023-01-06

## 2022-10-31 MED ORDER — NALTREXONE HCL 50 MG PO TABS
50.0000 mg | ORAL_TABLET | Freq: Every day | ORAL | 0 refills | Status: DC
Start: 2022-10-31 — End: 2023-04-07

## 2022-10-31 MED ORDER — DEXTROAMPHETAMINE SULFATE ER 15 MG PO CP24
15.0000 mg | ORAL_CAPSULE | Freq: Every day | ORAL | 0 refills | Status: DC
Start: 2022-10-31 — End: 2022-11-26

## 2022-11-26 ENCOUNTER — Other Ambulatory Visit: Payer: Self-pay | Admitting: Nurse Practitioner

## 2022-11-26 DIAGNOSIS — F902 Attention-deficit hyperactivity disorder, combined type: Secondary | ICD-10-CM

## 2022-11-28 ENCOUNTER — Other Ambulatory Visit: Payer: Self-pay | Admitting: Nurse Practitioner

## 2022-11-28 MED ORDER — NITROFURANTOIN MONOHYD MACRO 100 MG PO CAPS: 100.0000 mg | ORAL_CAPSULE | Freq: Every day | ORAL | 0 refills | Status: AC | PRN

## 2022-12-30 ENCOUNTER — Other Ambulatory Visit: Payer: Self-pay | Admitting: Nurse Practitioner

## 2022-12-30 DIAGNOSIS — F902 Attention-deficit hyperactivity disorder, combined type: Secondary | ICD-10-CM

## 2022-12-30 MED ORDER — PHENTERMINE HCL 37.5 MG PO TABS: ORAL_TABLET | ORAL | 0 refills | Status: DC

## 2023-01-06 ENCOUNTER — Other Ambulatory Visit: Payer: Self-pay | Admitting: Nurse Practitioner

## 2023-01-06 DIAGNOSIS — F3181 Bipolar II disorder: Secondary | ICD-10-CM

## 2023-01-06 DIAGNOSIS — Z79899 Other long term (current) drug therapy: Secondary | ICD-10-CM

## 2023-01-28 MED ORDER — PHENTERMINE HCL 37.5 MG PO TABS
ORAL_TABLET | ORAL | 0 refills | Status: DC
Start: 2023-01-28 — End: 2023-04-07

## 2023-02-25 ENCOUNTER — Encounter: Payer: Self-pay | Admitting: Nurse Practitioner

## 2023-02-25 ENCOUNTER — Ambulatory Visit (INDEPENDENT_AMBULATORY_CARE_PROVIDER_SITE_OTHER): Payer: 59 | Admitting: Nurse Practitioner

## 2023-02-25 VITALS — BP 108/72 | HR 65 | Temp 97.8°F | Ht 64.5 in | Wt 128.4 lb

## 2023-02-25 DIAGNOSIS — Z6823 Body mass index (BMI) 23.0-23.9, adult: Secondary | ICD-10-CM

## 2023-02-25 DIAGNOSIS — F902 Attention-deficit hyperactivity disorder, combined type: Secondary | ICD-10-CM

## 2023-02-25 DIAGNOSIS — E559 Vitamin D deficiency, unspecified: Secondary | ICD-10-CM | POA: Diagnosis not present

## 2023-02-25 DIAGNOSIS — F419 Anxiety disorder, unspecified: Secondary | ICD-10-CM

## 2023-02-25 DIAGNOSIS — E782 Mixed hyperlipidemia: Secondary | ICD-10-CM

## 2023-02-25 DIAGNOSIS — N920 Excessive and frequent menstruation with regular cycle: Secondary | ICD-10-CM

## 2023-02-25 DIAGNOSIS — E538 Deficiency of other specified B group vitamins: Secondary | ICD-10-CM

## 2023-02-25 DIAGNOSIS — Z79899 Other long term (current) drug therapy: Secondary | ICD-10-CM

## 2023-02-25 DIAGNOSIS — F439 Reaction to severe stress, unspecified: Secondary | ICD-10-CM

## 2023-02-25 DIAGNOSIS — Z87891 Personal history of nicotine dependence: Secondary | ICD-10-CM | POA: Diagnosis not present

## 2023-02-25 NOTE — Progress Notes (Signed)
Follow   Assessment and Plan:  Hyperlipidemia Mild elevations managed by lifestyle  Discussed lifestyle modifications. Recommended diet heavy in fruits and veggies, omega 3's. Decrease consumption of animal meats, cheeses, and dairy products. Remain active and exercise as tolerated. Continue to monitor. Check lipids/TSH  Former Tobacco use/ E. Cigs Quit successfully 08/22/2020 Not meeting criteria for low dose CT Monitor for concerning sx  Vitamin D deficiency Continue supplement for goal of 60-100 Monitor Vitamin D levels  Medication management All medications discussed and reviewed in full. All questions and concerns regarding medications addressed.    BMI 23 Discontinue Phentermine Discussed appropriate BMI Diet modification. Physical activity. Encouraged/praised to build confidence.  High stress/anxiety Medications currently effective. Reviewed relaxation techniques.  Sleep hygiene. Cognitive Behavioral Therapy (CBT) PRN Recommended mindfulness meditation and exercise.   Encouraged personality growth wand development through coping techniques and problem-solving skills. Limit/Decrease/Monitor drug/alcohol intake.    ADHD ASRS - 1.1 reviewed and positive for ADHD DSM 5 Criteria reviewed  Start with lowest effective dose. Discussed alternative pharmacological and non-pharmacological therapies. No suspected aberrant drug-taking behaviors. Continue to monitor  Short menstruation - change in cycle Requesting to obtain hormonal levels  Will check FSH and LH  Continue to monitor  Future Appointments  Date Time Provider Department Center  08/25/2023  2:00 PM Adela Glimpse, NP [facility] None    HPI  42 y.o. female  presents for a complete physical. She has Vitamin D deficiency; Hyperlipidemia; Raynaud's disease without gangrene; Former Engineer, materials cigarette use (Quit 08/22/2020); B12 deficiency; CKD (chronic kidney disease) stage 2, GFR 60-89 ml/min; Anxiety; High  perceived stress; Alcohol consumption binge drinking; Bipolar 2 disorder (HCC); Attention deficit hyperactivity disorder (ADHD), combined type; and Epistaxis on their problem list.  She is single and happy.  Enjoys traveling.  Just returned from [place].    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  Continues with intermittent  anxiety, feels this is related to hx of attention deficient, not staying organized, procrastinating.  Phentermine working well to keep weight managed and focus/organization.  Admits to staying up late to finish projects.  Has some PTSD, currently in counseling.  She has been working with therapist from [place] virtually for stress/anxiety - wants to avoid SSRI/daily agent for now.   Concerned for possible perimenopausal state with increase in shorter cycle - every 23 days. Some thinning hair, hair loss, increase in irritability.  Currently menstrating LMP 02/24/23.  She has a history of ectopic pregnancy 2013 s/p fallopion tube removal, follows with Dr. Braxton Feathers annually.   She is a former smoker, quit cold [place] 07/2020. Last CXR in 2016 was normal.   She has Raynauds in bil hands, neg autoimmune work up. Manages with lifestyle changes.   Reports newly following with spine/scoliosis center for back pain, disc disease, L5/S1 arthritic changes, starting injections soon.   BMI is Body mass index is 21.7 kg/m., she has been working on diet and exercise.  Has had sucsses with weight loss using Phentermine in the past.  Wt Readings from Last 3 Encounters:  02/25/23 128 lb 6.4 oz (58.2 kg)  08/25/22 127 lb 6.4 oz (57.8 kg)  02/25/22 125 lb (56.7 kg)   Today their BP is BP: 108/72 She does workout, She denies chest pain, shortness of breath, dizziness.   She is not on cholesterol medication and denies myalgias. Her cholesterol is not at goal. The cholesterol last visit was:   Lab Results  Component Value Date    CHOL 183 08/25/2022   HDL 53 08/25/2022   LDLCALC 102 (H) 08/25/2022   TRIG 163 (H) 08/25/2022   CHOLHDL 3.5 08/25/2022   She has had elevated insulin in the past. Most recent A1C in the office was:  Lab Results  Component Value Date   HGBA1C 5.6 08/25/2022   Patient is regularly taking a Vitamin D supplement as multivitamin, hasn't changed dose -  Lab Results  Component Value Date   VD25OH 118 (H) 08/25/2022     Current Medications:  Current Outpatient Medications on File Prior to Visit  Medication Sig Dispense Refill   Cholecalciferol (VITAMIN D) 125 MCG (5000 UT) CAPS Take by mouth.     lamoTRIgine (LAMICTAL) 100 MG tablet Take 1 tab (100 mg) daily. 90 tablet 0   MAGNESIUM PO Take by mouth.     Multiple Vitamin (MULTIVITAMIN) tablet Take 1 tablet by mouth daily.     naltrexone (DEPADE) 50 MG tablet Take 1 tablet (50 mg total) by mouth daily. 90 tablet 0   nitrofurantoin, macrocrystal-monohydrate, (MACROBID) 100 MG capsule Take 1 capsule (100 mg total) by mouth daily as needed. Before or after sexual intercourse for UTI prophylaxis. 90 capsule 0   phentermine (ADIPEX-P) 37.5 MG tablet TAKE ONE TABLET BY MOUTH EVERY MORNING 30 tablet 0   dextroamphetamine (DEXEDRINE SPANSULE) 15 MG 24 hr capsule TAKE ONE CAPSULE BY MOUTH EVERY DAY (Patient not taking: Reported on 02/25/2023) 30 capsule 0   Multiple Vitamins-Minerals (HAIR SKIN AND NAILS FORMULA PO) Take by mouth. (Patient not taking: Reported on 02/25/2023)     No current facility-administered medications on file prior to visit.   Health Maintenance:   Immunization History  Administered Date(s) Administered   Influenza,inj,Quad PF,6+ Mos 01/14/2017, 11/27/2017   Influenza-Unspecified 01/14/2017, 11/27/2017   PFIZER Comirnaty(Gray Top)Covid-19 Tri-Sucrose Vaccine 01/01/2021   PFIZER(Purple Top)SARS-COV-2 Vaccination 06/02/2019, 06/28/2019, 01/09/2020   Tdap 06/28/2012   Health Maintenance  Topic Date Due   Cervical Cancer  Screening (HPV/Pap Cotest)  Never done   DTaP/Tdap/Td (2 - Td or Tdap) 06/29/2022   COVID-19 Vaccine (5 - 2023-24 season) 11/23/2022   MAMMOGRAM  09/24/2023   INFLUENZA VACCINE  Completed   HIV Screening  Completed   HPV VACCINES  Aged Out   Hepatitis C Screening  Discontinued   Patient Care Team: Adela Glimpse, NP as PCP - General (Nurse Practitioner) Judd Gaudier, NP as Nurse Practitioner (Nurse Practitioner)  Medical History:  Past Medical History:  Diagnosis Date   Allergy    RHINITIS   Anal fissure    Anemia    Blood transfusion    Ectopic pregnancy 03/25/2011   ruptured just prior to having abortion   Heart murmur    Prediabetes    Seizure disorder (HCC)    5th grade   Vitamin D deficiency    Allergies Allergies  Allergen Reactions   Penicillins Hives    SURGICAL HISTORY She  has a past surgical history that includes Dilation and curettage of uterus (03/2011); laparotomy (04/17/2011); and Salpingectomy (Right, 04/17/2011). FAMILY HISTORY Her family history includes Arthritis in her paternal grandmother; Asthma in her paternal grandmother; Birth defects in her paternal grandmother; Breast cancer in her cousin and paternal grandmother; COPD in her maternal grandfather; Cancer in her paternal grandfather; Diabetes in her maternal grandmother; Heart disease in her maternal uncle, mother, and paternal grandfather; Hypertension in her maternal grandmother, paternal grandfather, and paternal grandmother; Idiopathic pulmonary fibrosis in her maternal uncle; Mental  illness in her maternal aunt; Other in her maternal aunt; Stroke in her father and paternal grandfather. SOCIAL HISTORY She  reports that she quit smoking about 2 years ago. Her smoking use included e-cigarettes and cigarettes. She started smoking about 32 years ago. She has a 15.8 pack-year smoking history. She has never used smokeless tobacco. She reports current alcohol use. She reports that she does not use  drugs.  Review of Systems: Review of Systems  Constitutional:  Negative for malaise/fatigue and weight loss.  HENT:  Negative for hearing loss and tinnitus.   Eyes:  Negative for blurred vision and double vision.  Respiratory:  Negative for cough, shortness of breath and wheezing.   Cardiovascular:  Negative for chest pain, palpitations, orthopnea, claudication and leg swelling.  Gastrointestinal:  Negative for abdominal pain, blood in stool, constipation, diarrhea, heartburn, melena, nausea and vomiting.  Genitourinary: Negative.   Musculoskeletal:  Negative for joint pain and myalgias.  Skin:  Negative for rash.  Neurological:  Negative for dizziness, tingling, sensory change, weakness and headaches.  Endo/Heme/Allergies:  Negative for polydipsia.  Psychiatric/Behavioral:  Negative for depression and substance abuse. The patient is nervous/anxious.     Physical Exam: Estimated body mass index is 21.7 kg/m as calculated from the following:   Height as of this encounter: 5' 4.5" (1.638 m).   Weight as of this encounter: 128 lb 6.4 oz (58.2 kg). BP 108/72   Pulse 65   Temp 97.8 F (36.6 C)   Ht 5' 4.5" (1.638 m)   Wt 128 lb 6.4 oz (58.2 kg)   SpO2 99%   BMI 21.70 kg/m  General Appearance: Well nourished/slim, well dressed adult female in no apparent distress.  Eyes: PERRLA, EOMs, conjunctiva no swelling or erythema Sinuses: No Frontal/maxillary tenderness  ENT/Mouth: Ext aud canals clear, normal light reflex with TMs without erythema, bulging. Good dentition. No erythema, swelling, or exudate on post pharynx. Tonsils not swollen or erythematous. Hearing normal.  Neck: Supple, thyroid normal. No bruits  Respiratory: Respiratory effort normal, BS equal bilaterally without rales, rhonchi, wheezing or stridor.  Cardio: RRR without murmurs, rubs or gallops. Brisk peripheral pulses without edema.  Chest: symmetric, with normal excursions and percussion.  Breasts: defer to  GYN Abdomen: Soft, nontender, no guarding, rebound, hernias, masses, or organomegaly.  Lymphatics: Non tender without lymphadenopathy.  Genitourinary: defer to GYN Musculoskeletal: Full ROM all peripheral extremities,5/5 strength, and normal gait.  Skin: Warm, dry without rashes, lesions, ecchymosis. Neuro: Cranial nerves intact, reflexes equal bilaterally. Normal muscle tone, no cerebellar symptoms. Sensation intact.  Psych: Awake and oriented X 3, normal affect, Insight and Judgment appropriate.   Adela Glimpse, DNP, AGNP-C 12:05 PM [facility]

## 2023-02-25 NOTE — Patient Instructions (Signed)
Perimenopause Perimenopause is the normal time of a woman's life when the levels of estrogen, the female hormone produced by the ovaries, begin to decrease. This leads to changes in menstrual periods before they stop completely (menopause). Perimenopause can begin 2-8 years before menopause. During perimenopause, the ovaries may or may not produce an egg and a woman can still become pregnant. What are the causes? This condition is caused by a natural change in hormone levels that happens as you get older. What increases the risk? This condition is more likely to start at an earlier age if you have certain medical conditions or have undergone treatments, including: A tumor of the pituitary gland in the brain. A disease that affects the ovaries and hormone production. Certain cancer treatments, such as chemotherapy or hormone therapy, or radiation therapy on the pelvis. Heavy smoking and excessive alcohol use. Family history of early menopause. What are the signs or symptoms? Perimenopausal changes affect each woman differently. Symptoms of this condition may include: Hot flashes. Irregular menstrual periods. Night sweats. Changes in feelings about sex. This could be a decrease in sex drive or an increased discomfort around your sexuality. Vaginal dryness. Headaches. Mood swings. Depression. Problems sleeping (insomnia). Memory problems or trouble concentrating. Irritability. Tiredness. Weight gain. Anxiety. Trouble getting pregnant. How is this diagnosed? This condition is diagnosed based on your medical history, a physical exam, your age, your menstrual history, and your symptoms. Hormone tests may also be done. How is this treated? In some cases, no treatment is needed. You and your health care provider should make a decision together about whether treatment is necessary. Treatment will be based on your individual condition and preferences. Various treatments are available, such  as: Menopausal hormone therapy (MHT). Medicines to treat specific symptoms. Acupuncture. Vitamin or herbal supplements. Before starting treatment, make sure to let your health care provider know if you have a personal or family history of: Heart disease. Breast cancer. Blood clots. Diabetes. Osteoporosis. Follow these instructions at home: Medicines Take over-the-counter and prescription medicines only as told by your health care provider. Take vitamin supplements only as told by your health care provider. Talk with your health care provider before starting any herbal supplements. Lifestyle  Do not use any products that contain nicotine or tobacco, such as cigarettes, e-cigarettes, and chewing tobacco. If you need help quitting, ask your health care provider. Get at least 30 minutes of physical activity on 5 or more days each week. Eat a balanced diet that includes fresh fruits and vegetables, whole grains, soybeans, eggs, lean meat, and low-fat dairy. Avoid alcoholic and caffeinated beverages, as well as spicy foods. This may help prevent hot flashes. Get 7-8 hours of sleep each night. Dress in layers that can be removed to help you manage hot flashes. Find ways to manage stress, such as deep breathing, meditation, or journaling. General instructions  Keep track of your menstrual periods, including: When they occur. How heavy they are and how long they last. How much time passes between periods. Keep track of your symptoms, noting when they start, how often you have them, and how long they last. Use vaginal lubricants or moisturizers to help with vaginal dryness and improve comfort during sex. You can still become pregnant if you are having irregular periods. Make sure you use contraception during perimenopause if you do not want to get pregnant. Keep all follow-up visits. This is important. This includes any group therapy or counseling. Contact a health care provider if: You  have  heavy vaginal bleeding or pass blood clots. Your period lasts more than 2 days longer than normal. Your periods are recurring sooner than 21 days. You bleed after having sex. You have pain during sex. Get help right away if you have: Chest pain, trouble breathing, or trouble talking. Severe depression. Pain when you urinate. Severe headaches. Vision problems. Summary Perimenopause is the time when a woman's body begins to move into menopause. This may happen naturally or as a result of other health problems or medical treatments. Perimenopause can begin 2-8 years before menopause, and it can last for several years. Perimenopausal symptoms can be managed through medicines, lifestyle changes, and complementary therapies such as acupuncture. This information is not intended to replace advice given to you by your health care provider. Make sure you discuss any questions you have with your health care provider. Document Revised: 08/25/2019 Document Reviewed: 08/25/2019 Elsevier Patient Education  2024 [employer].

## 2023-02-26 LAB — COMPLETE METABOLIC PANEL WITH GFR
AG Ratio: 1.5 (calc) (ref 1.0–2.5)
ALT: 25 U/L (ref 6–29)
AST: 28 U/L (ref 10–30)
Albumin: 4.3 g/dL (ref 3.6–5.1)
Alkaline phosphatase (APISO): 43 U/L (ref 31–125)
BUN: 21 mg/dL (ref 7–25)
CO2: 30 mmol/L (ref 20–32)
Calcium: 9.4 mg/dL (ref 8.6–10.2)
Chloride: 100 mmol/L (ref 98–110)
Creat: 0.73 mg/dL (ref 0.50–0.99)
Globulin: 2.9 g/dL (ref 1.9–3.7)
Glucose, Bld: 93 mg/dL (ref 65–99)
Potassium: 4 mmol/L (ref 3.5–5.3)
Sodium: 139 mmol/L (ref 135–146)
Total Bilirubin: 0.3 mg/dL (ref 0.2–1.2)
Total Protein: 7.2 g/dL (ref 6.1–8.1)
eGFR: 105 mL/min/{1.73_m2} (ref >=60)

## 2023-02-26 LAB — CBC WITH DIFFERENTIAL/PLATELET
Absolute Lymphocytes: 2133 {cells}/uL (ref 850–3900)
Absolute Monocytes: 639 {cells}/uL (ref 200–950)
Basophils Absolute: 39 {cells}/uL (ref 0–200)
Basophils Relative: 0.5 %
Eosinophils Absolute: 46 {cells}/uL (ref 15–500)
Eosinophils Relative: 0.6 %
HCT: 39 % (ref 35.0–45.0)
Hemoglobin: 13.1 g/dL (ref 11.7–15.5)
MCH: 32.5 pg (ref 27.0–33.0)
MCHC: 33.6 g/dL (ref 32.0–36.0)
MCV: 96.8 fL (ref 80.0–100.0)
MPV: 9.7 fL (ref 7.5–12.5)
Monocytes Relative: 8.3 %
Neutro Abs: 4843 {cells}/uL (ref 1500–7800)
Neutrophils Relative %: 62.9 %
Platelets: 325 10*3/uL (ref 140–400)
RBC: 4.03 10*6/uL (ref 3.80–5.10)
RDW: 12.2 % (ref 11.0–15.0)
Total Lymphocyte: 27.7 %
WBC: 7.7 10*3/uL (ref 3.8–10.8)

## 2023-02-26 LAB — LIPID PANEL
Cholesterol: 237 mg/dL — ABNORMAL HIGH (ref <200)
HDL: 62 mg/dL (ref >=50)
LDL Cholesterol (Calc): 151 mg/dL — ABNORMAL HIGH
Non-HDL Cholesterol (Calc): 175 mg/dL — ABNORMAL HIGH (ref <130)
Total CHOL/HDL Ratio: 3.8 (calc) (ref <5.0)
Triglycerides: 121 mg/dL (ref <150)

## 2023-02-26 LAB — VITAMIN D 25 HYDROXY (VIT D DEFICIENCY, FRACTURES): Vit D, 25-Hydroxy: 66 ng/mL (ref 30–100)

## 2023-02-26 LAB — VITAMIN B12: Vitamin B-12: 704 pg/mL (ref 200–1100)

## 2023-03-11 ENCOUNTER — Other Ambulatory Visit: Payer: 59

## 2023-03-11 DIAGNOSIS — N920 Excessive and frequent menstruation with regular cycle: Secondary | ICD-10-CM

## 2023-03-12 LAB — FSH/LH
FSH: 8.2 m[IU]/mL
LH: 27.4 m[IU]/mL

## 2023-04-07 ENCOUNTER — Other Ambulatory Visit: Payer: Self-pay | Admitting: Nurse Practitioner

## 2023-04-07 DIAGNOSIS — F3181 Bipolar II disorder: Secondary | ICD-10-CM

## 2023-04-07 DIAGNOSIS — F419 Anxiety disorder, unspecified: Secondary | ICD-10-CM

## 2023-04-07 DIAGNOSIS — Z6823 Body mass index (BMI) 23.0-23.9, adult: Secondary | ICD-10-CM

## 2023-04-07 DIAGNOSIS — F1010 Alcohol abuse, uncomplicated: Secondary | ICD-10-CM

## 2023-04-07 DIAGNOSIS — Z79899 Other long term (current) drug therapy: Secondary | ICD-10-CM

## 2023-04-07 MED ORDER — LAMOTRIGINE 100 MG PO TABS
ORAL_TABLET | ORAL | 0 refills | Status: DC
Start: 2023-04-07 — End: 2023-05-19

## 2023-04-07 MED ORDER — PHENTERMINE HCL 37.5 MG PO TABS: ORAL_TABLET | ORAL | 0 refills | Status: DC

## 2023-04-07 MED ORDER — NALTREXONE HCL 50 MG PO TABS
50.0000 mg | ORAL_TABLET | Freq: Every day | ORAL | 0 refills | Status: DC
Start: 2023-04-07 — End: 2023-05-19

## 2023-04-08 ENCOUNTER — Other Ambulatory Visit: Payer: Self-pay | Admitting: Nurse Practitioner

## 2023-04-08 DIAGNOSIS — F1010 Alcohol abuse, uncomplicated: Secondary | ICD-10-CM

## 2023-05-19 ENCOUNTER — Other Ambulatory Visit: Payer: Self-pay

## 2023-05-19 DIAGNOSIS — Z79899 Other long term (current) drug therapy: Secondary | ICD-10-CM

## 2023-05-19 DIAGNOSIS — F419 Anxiety disorder, unspecified: Secondary | ICD-10-CM

## 2023-05-19 DIAGNOSIS — F3181 Bipolar II disorder: Secondary | ICD-10-CM

## 2023-05-19 DIAGNOSIS — F1010 Alcohol abuse, uncomplicated: Secondary | ICD-10-CM

## 2023-05-19 DIAGNOSIS — Z6823 Body mass index (BMI) 23.0-23.9, adult: Secondary | ICD-10-CM

## 2023-05-19 MED ORDER — PHENTERMINE HCL 37.5 MG PO TABS: ORAL_TABLET | ORAL | 0 refills | Status: DC

## 2023-05-19 MED ORDER — LAMOTRIGINE 100 MG PO TABS: ORAL_TABLET | ORAL | 0 refills | Status: AC

## 2023-05-19 MED ORDER — NALTREXONE HCL 50 MG PO TABS: 50.0000 mg | ORAL_TABLET | Freq: Every day | ORAL | 0 refills | Status: AC

## 2023-07-15 ENCOUNTER — Other Ambulatory Visit: Payer: Self-pay | Admitting: Family

## 2023-07-15 DIAGNOSIS — Z6823 Body mass index (BMI) 23.0-23.9, adult: Secondary | ICD-10-CM

## 2023-07-15 DIAGNOSIS — F419 Anxiety disorder, unspecified: Secondary | ICD-10-CM

## 2023-07-15 DIAGNOSIS — F3181 Bipolar II disorder: Secondary | ICD-10-CM

## 2023-07-15 DIAGNOSIS — Z79899 Other long term (current) drug therapy: Secondary | ICD-10-CM

## 2023-07-15 NOTE — Telephone Encounter (Signed)
 Copied from CRM [id number]. Topic: Clinical - Medication Refill >> Jul 15, 2023  1:25 PM Luane Rumps D wrote: Most Recent Primary Care Visit:   Medication: phentermine  (ADIPEX-P ) 37.5 MG tablet  Has the patient contacted their pharmacy? Yes (Agent: If no, request that the patient contact the pharmacy for the refill. If patient does not wish to contact the pharmacy document the reason why and proceed with request.) (Agent: If yes, when and what did the pharmacy advise?)  Is this the correct pharmacy for this prescription? Yes If no, delete pharmacy and type the correct one.  This is the patient's preferred pharmacy:  [facility] - [place], [place] - [address] [place] [address] Phone: [phone] Fax: [phone]  Has the prescription been filled recently? No  Is the patient out of the medication? Yes  Has the patient been seen for an appointment in the last year OR does the patient have an upcoming appointment? Yes  Can we respond through MyChart? Yes  Agent: Please be advised that Rx refills may take up to 3 business days. We ask that you follow-up with your pharmacy.

## 2023-07-16 ENCOUNTER — Other Ambulatory Visit: Payer: Self-pay | Admitting: Family

## 2023-07-16 ENCOUNTER — Telehealth: Payer: Self-pay | Admitting: *Deleted

## 2023-07-16 ENCOUNTER — Telehealth: Payer: Self-pay

## 2023-07-16 ENCOUNTER — Other Ambulatory Visit: Payer: Self-pay | Admitting: Nurse Practitioner

## 2023-07-16 DIAGNOSIS — Z79899 Other long term (current) drug therapy: Secondary | ICD-10-CM

## 2023-07-16 DIAGNOSIS — F902 Attention-deficit hyperactivity disorder, combined type: Secondary | ICD-10-CM

## 2023-07-16 DIAGNOSIS — F419 Anxiety disorder, unspecified: Secondary | ICD-10-CM

## 2023-07-16 DIAGNOSIS — Z6823 Body mass index (BMI) 23.0-23.9, adult: Secondary | ICD-10-CM

## 2023-07-16 DIAGNOSIS — F3181 Bipolar II disorder: Secondary | ICD-10-CM

## 2023-07-16 MED ORDER — PHENTERMINE HCL 37.5 MG PO TABS: ORAL_TABLET | ORAL | 0 refills | Status: AC

## 2023-07-16 NOTE — Telephone Encounter (Signed)
 Copied from CRM [id number]. Topic: Clinical - Prescription Issue >> Jul 16, 2023  3:41 PM Alpha Arts wrote: Reason for CRM: Pharmacy stated Webb, Padonda FNP need to sign off on phentermine  (ADIPEX-P ) 37.5 MG tablet for patient   Callback #: [id number]

## 2023-07-16 NOTE — Telephone Encounter (Signed)
 Copied from CRM [id number]. Topic: Clinical - Prescription Issue >> Jul 16, 2023  3:41 PM [employer] wrote: Reason for CRM: Pharmacy stated Webb, Padonda FNP need to sign off on phentermine  (ADIPEX-P ) 37.5 MG tablet for patient   Callback #: [id number] >> Jul 16, 2023  4:04 PM Juluis Ok wrote: Patient is a former patient of Dr. Johnette Naegeli and states she is needing a refill and was informed that provider,Webb, Padonda FNP, is located at this clinic. Informed patient provider, is not located at this facility.  Patient requesting a callback at [phone]

## 2023-07-17 ENCOUNTER — Ambulatory Visit: Payer: Self-pay

## 2023-07-17 ENCOUNTER — Other Ambulatory Visit: Payer: Self-pay | Admitting: Family

## 2023-07-17 NOTE — Telephone Encounter (Signed)
 2nd attempt, "call cannot be completed as dialed" error message when dialing out patient's phone #.   Message from [place] E sent at 07/17/2023  9:28 AM EDT   Summary: Requesting Rx, PCP no longer alive              Copied From CRM [id number]. Reason for Triage: Pt called and is seeking ADD medication, former patient of Dr. Cassondra Cliff. Wants to speak to clinic about how to receive her meds.   Best contact: [phone]     [facility] has in placed that Courtney Merlin, NP will refill patients of Dr. Cassondra Cliff while waiting on the NP appointments. Patient needs to schedule a NP appointment and then any med refills can be sent to NP [facility] (refill encounter can be routed to [facility] clinical pool)

## 2023-07-17 NOTE — Telephone Encounter (Signed)
 Message from [place] E sent at 07/17/2023  9:28 AM EDT  Summary: Requesting Rx, PCP no longer alive   Copied From CRM [id number]. Reason for Triage: Pt called and is seeking ADD medication, former patient of Dr. Cassondra Cliff. Wants to speak to clinic about how to receive her meds.  Best contact: [phone]   Patient states that she has been trying to get her phentermine  filled but has been having difficulty. Patient used to be seen at Dr. Melene Sportsman practice. I advised that the prescription was sent yesterday to her pharmacy and that I can see that there was confirmation that it was received. Patient states she called earlier and they informed her that they have not received it. Patient will attempt to contact her pharmacy again about the prescription.    Reason for Disposition  [1] Prescription prescribed recently is not at pharmacy AND [2] triager has access to patient's EMR AND [3] prescription is recorded in the EMR  Answer Assessment - Initial Assessment Questions 1. DRUG NAME: "What medicine do you need to have refilled?"     Phentermine   Protocols used: Medication Refill and Renewal Call-A-AH

## 2023-07-17 NOTE — Telephone Encounter (Signed)
 1st attempt, call cannot be completed as dialed.   Message from [place] E sent at 07/17/2023  9:28 AM EDT  Summary: Requesting Rx, PCP no longer alive   Copied From CRM [id number]. Reason for Triage: Pt called and is seeking ADD medication, former patient of Dr. Cassondra Cliff. Wants to speak to clinic about how to receive her meds.  Best contact: [phone]

## 2023-07-17 NOTE — Telephone Encounter (Signed)
 3rd attempt, "call cannot be completed as dialed" error message. Patient does not have a new patient appointment scheduled, unable to route to any clinics. Closing encounter. Message from [place] E sent at 07/17/2023  9:28 AM EDT   Summary: Requesting Rx, PCP no longer alive              Copied From CRM [id number]. Reason for Triage: Pt called and is seeking ADD medication, former patient of Dr. Cassondra Cliff. Wants to speak to clinic about how to receive her meds.   Best contact: [phone]      [facility] has in place that Jarold Merlin, NP will refill patients of Dr. Cassondra Cliff while waiting on the NP appointments. Patient needs to schedule a NP appointment and then any med refills can be sent to NP [facility] (refill encounter can be routed to [facility] clinical pool)

## 2023-07-20 NOTE — Telephone Encounter (Signed)
 Left a detailed message with the information below at the patient's cell number.

## 2023-08-18 ENCOUNTER — Other Ambulatory Visit: Payer: Self-pay | Admitting: Family

## 2023-08-18 ENCOUNTER — Telehealth: Payer: Self-pay | Admitting: Family

## 2023-08-18 DIAGNOSIS — Z6823 Body mass index (BMI) 23.0-23.9, adult: Secondary | ICD-10-CM

## 2023-08-18 DIAGNOSIS — F3181 Bipolar II disorder: Secondary | ICD-10-CM

## 2023-08-18 DIAGNOSIS — F419 Anxiety disorder, unspecified: Secondary | ICD-10-CM

## 2023-08-18 DIAGNOSIS — Z79899 Other long term (current) drug therapy: Secondary | ICD-10-CM

## 2023-08-18 NOTE — Telephone Encounter (Unsigned)
 Copied from CRM [id number]. Topic: Clinical - Medication Refill >> Aug 18, 2023 10:57 AM Clyde Darling P wrote: Medication: phentermine  (ADIPEX-P ) 37.5 MG tablet  Has the patient contacted their pharmacy? Yes- [facility] ref[facility]  (Agent: If no, request that the patient contact the pharmacy for the refill. If patient does not wish to contact the pharmacy document the reason why and proceed with request.) (Agent: If yes, when and what did the pharmacy advise?)  This is the patient's preferred pharmacy:  [facility] - [place], [place] - [address] [place] [address] Phone: [phone] Fax: [phone]  Is this the correct pharmacy for this prescription? Yes If no, delete pharmacy and type the correct one.   Has the prescription been filled recently? No  Is the patient out of the medication? Yes- has been out for 4 days   Has the patient been seen for an appointment in the last year OR does the patient have an upcoming appointment? Yes  Can we respond through MyChart? Yes  Agent: Please be advised that Rx refills may take up to 3 business days. We ask that you follow-up with your pharmacy.

## 2023-08-19 NOTE — Telephone Encounter (Signed)
 Duplicate request, please see refill encounter for medication dated 08/18/23.  "Interface, Surescripts Out routed this conversation to Padonda C[facility] "

## 2023-08-21 ENCOUNTER — Telehealth: Admitting: Family Medicine

## 2023-08-21 ENCOUNTER — Other Ambulatory Visit: Payer: Self-pay | Admitting: Family

## 2023-08-21 DIAGNOSIS — F3181 Bipolar II disorder: Secondary | ICD-10-CM

## 2023-08-21 DIAGNOSIS — Z79899 Other long term (current) drug therapy: Secondary | ICD-10-CM

## 2023-08-21 DIAGNOSIS — F419 Anxiety disorder, unspecified: Secondary | ICD-10-CM

## 2023-08-21 DIAGNOSIS — Z760 Encounter for issue of repeat prescription: Secondary | ICD-10-CM

## 2023-08-21 DIAGNOSIS — Z6823 Body mass index (BMI) 23.0-23.9, adult: Secondary | ICD-10-CM

## 2023-08-21 NOTE — Progress Notes (Signed)
 Pt is advised to see pcp for refills of phentermine . She is seeing pcp June 11. Advised it is controlled.

## 2023-08-21 NOTE — Telephone Encounter (Signed)
 Last Fill: 07/16/23  Last OV:  Next OV:  Routing to provider for review/authorization.   Copied from CRM [id number]. Topic: Clinical - Medication Refill >> Aug 18, 2023 10:57 AM Clyde Darling P wrote: Medication: phentermine  (ADIPEX-P ) 37.5 MG tablet  Has the patient contacted their pharmacy? Yes- [facility]  (Agent: If no, request that the patient contact the pharmacy for the refill. If patient does not wish to contact the pharmacy document the reason why and proceed with request.) (Agent: If yes, when and what did the pharmacy advise?)  This is the patient's preferred pharmacy:  [facility] - [place], [place] - [address] [place] [address] Phone: [phone] Fax: [phone]  Is this the correct pharmacy for this prescription? Yes If no, delete pharmacy and type the correct one.   Has the prescription been filled recently? No  Is the patient out of the medication? Yes- has been out for 4 days   Has the patient been seen for an appointment in the last year OR does the patient have an upcoming appointment? Yes  Can we respond through MyChart? Yes  Agent: Please be advised that Rx refills may take up to 3 business days. We ask that you follow-up with your pharmacy.

## 2023-08-25 ENCOUNTER — Encounter: Payer: 59 | Admitting: Nurse Practitioner

## 2023-10-09 IMAGING — CR DG CERVICAL SPINE COMPLETE 4+V
7 series · 7 of 7 positions shown · non-contrast
Comparison: None.

CLINICAL DATA: Remote history of trauma, acute left neck pain and
tingling in left hand after lifting a suitcase

EXAM:
CERVICAL SPINE - COMPLETE 4+ VIEW

[w cervical spine lat (1 of 2)]
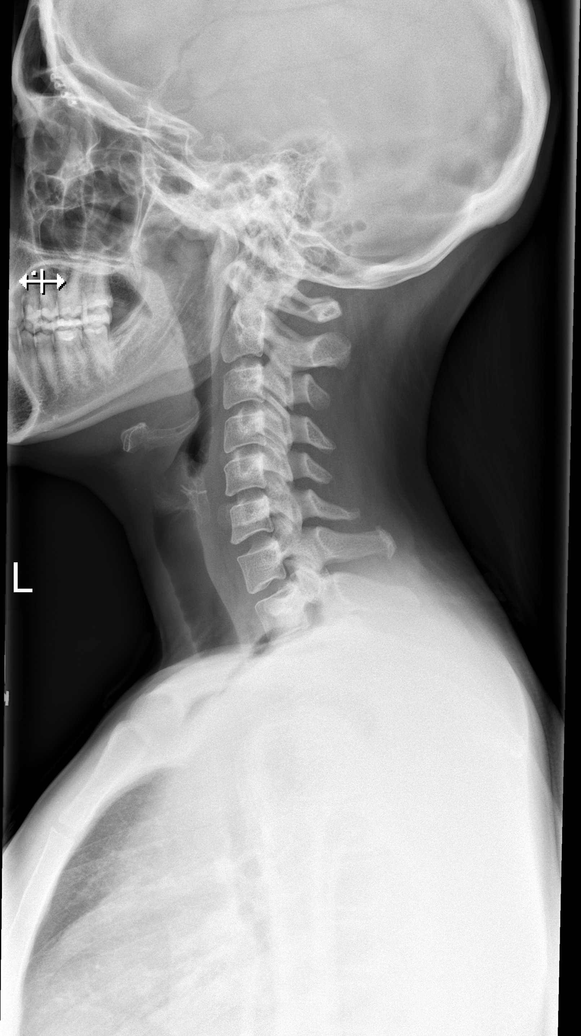

[w cervical spine ap_obl (1 of 2)]
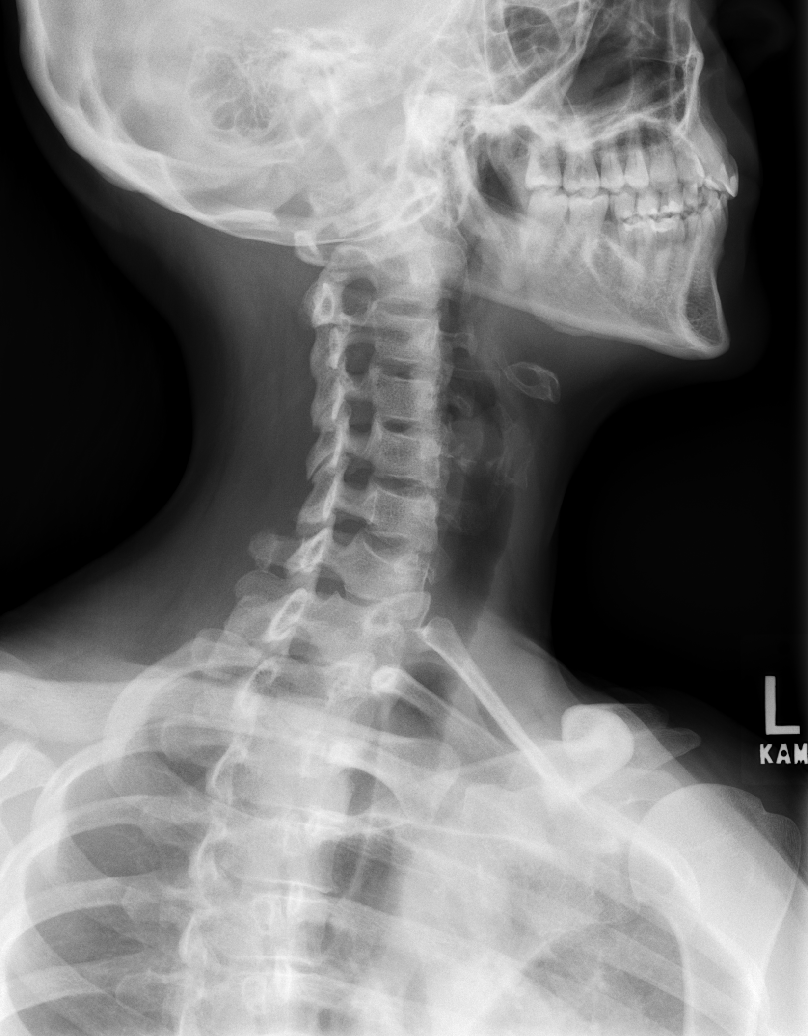

[w cervical spine lat (2 of 2)]
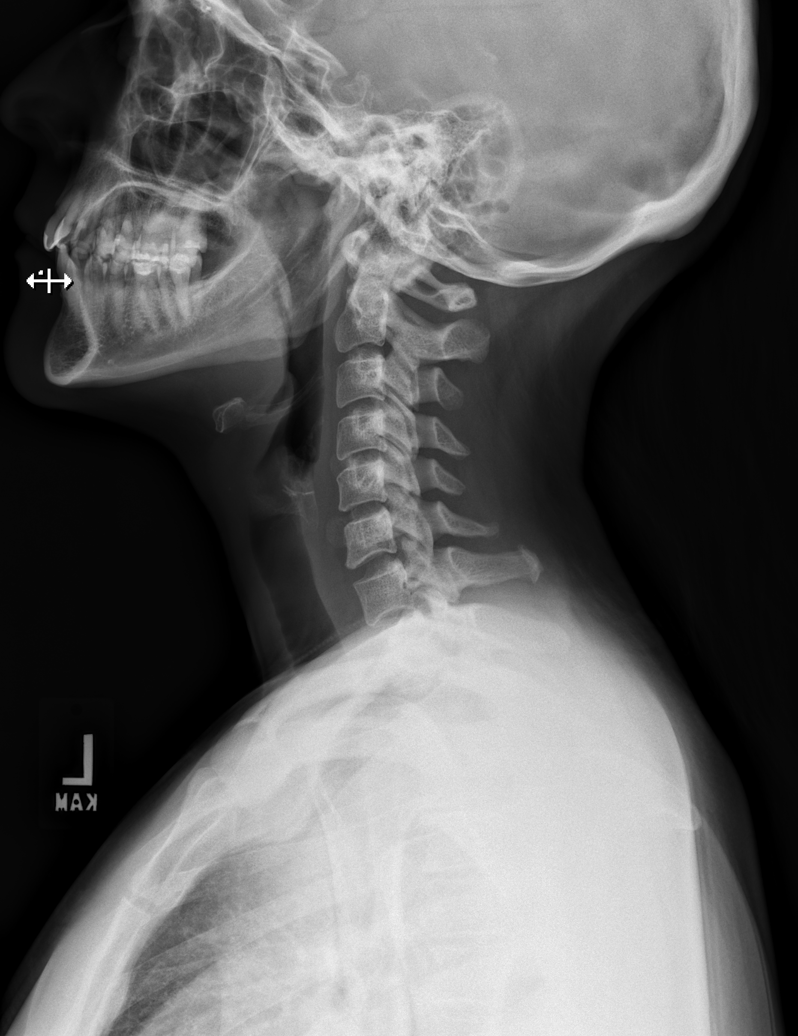

[w cervical spine ap_obl (2 of 2)]
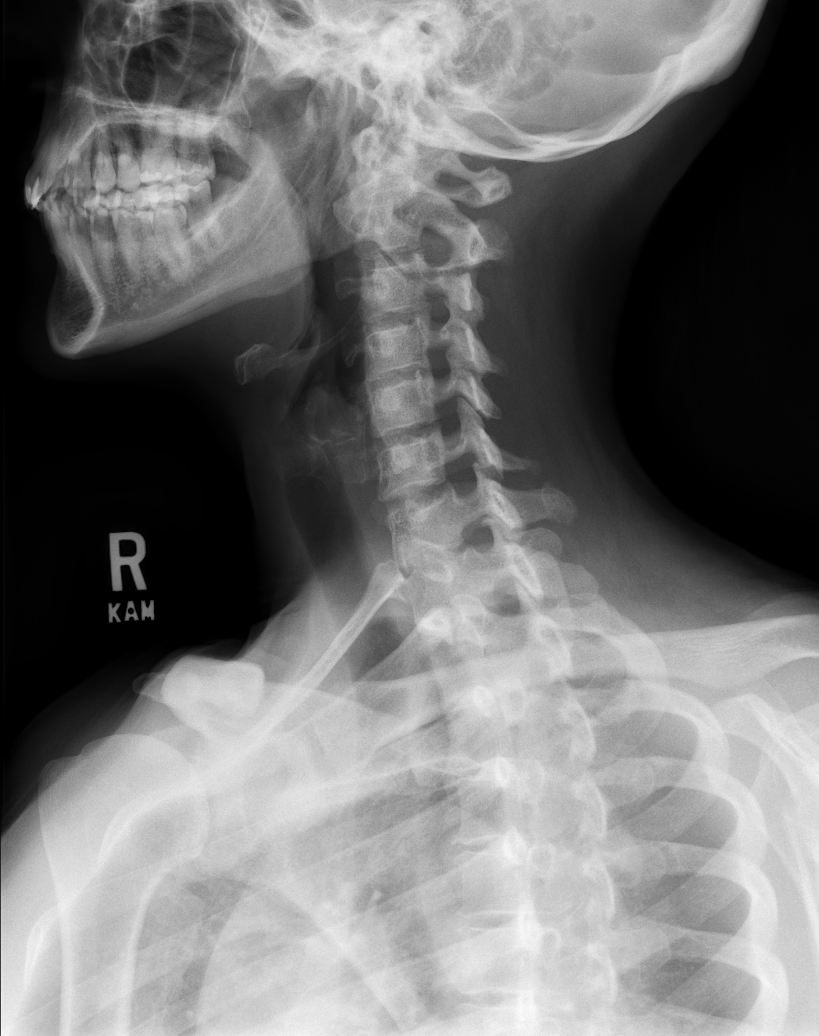

[w cervical spine ap]
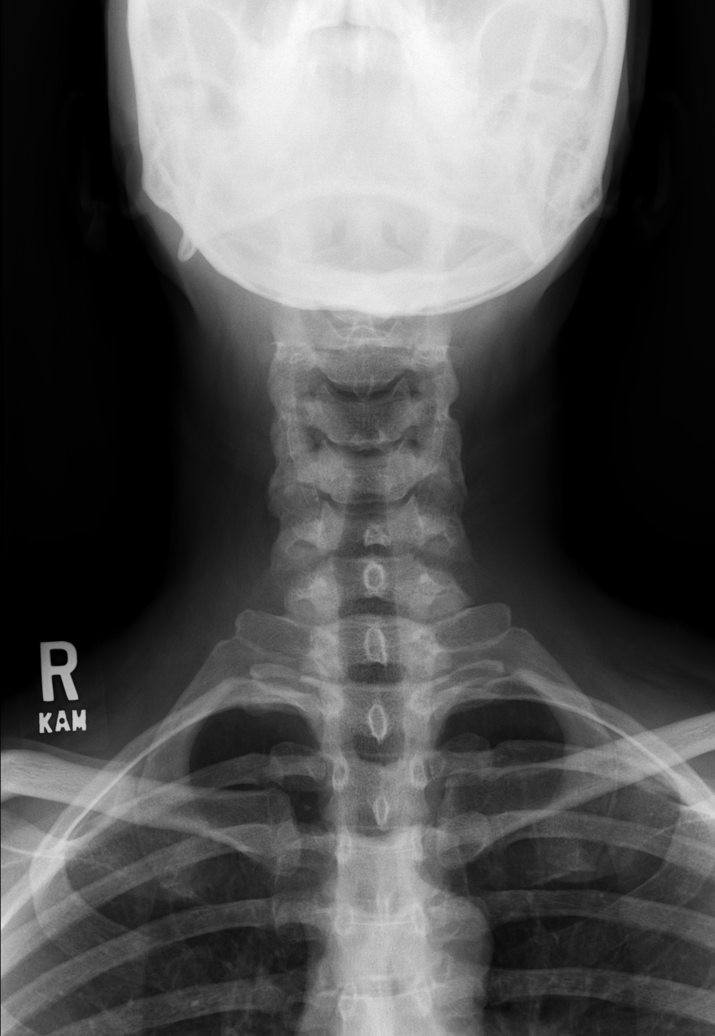

[w cervical spine odontoid (1 of 2)]
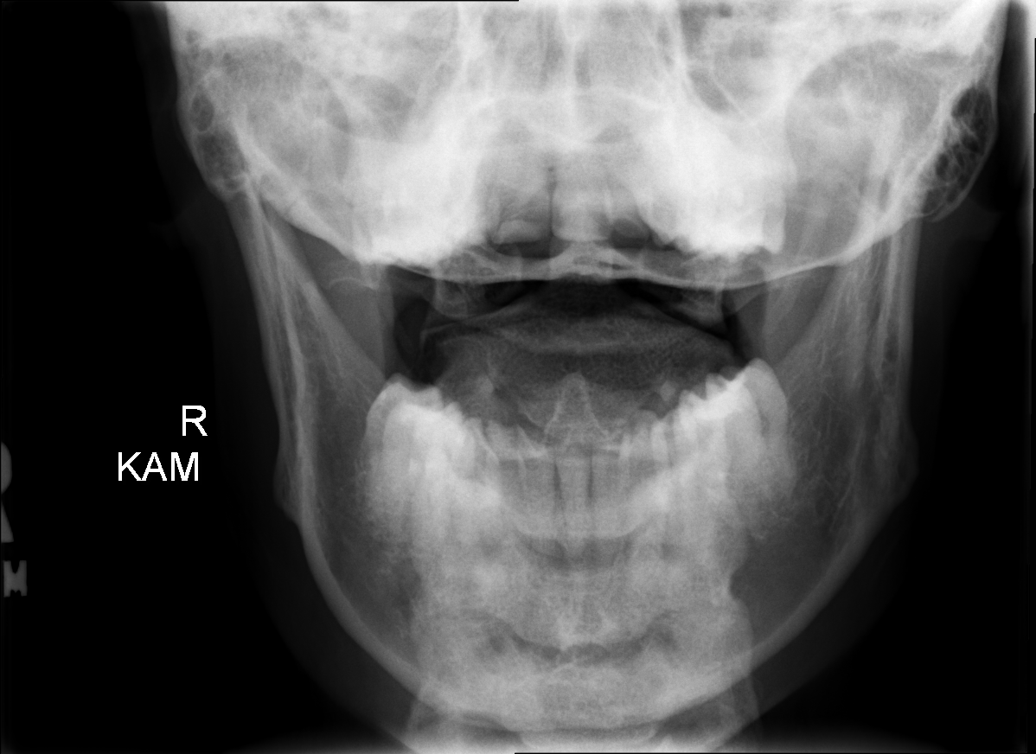

[w cervical spine odontoid (2 of 2)]
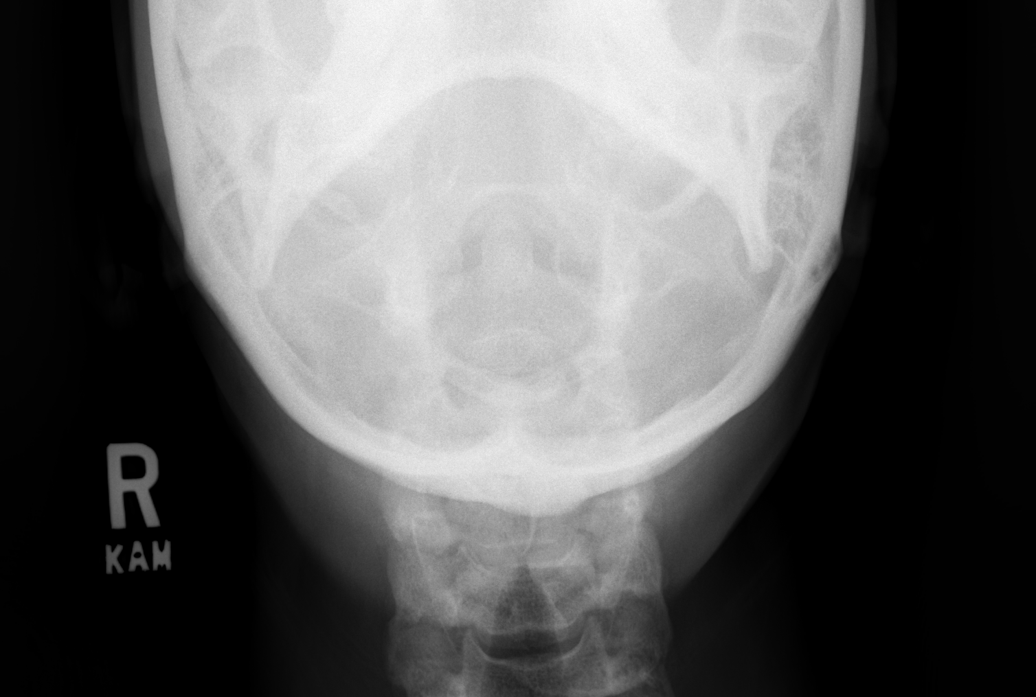

[7 of 7 positions shown; findings below may reference images not displayed]

FINDINGS: There is no evidence of cervical spine fracture or prevertebral soft
tissue swelling. Alignment is normal. Disc spaces and vertebral body
heights are preserved. No other significant bone abnormalities are
identified.
IMPRESSION: 1.  No fracture or static subluxation of the cervical spine.

2. Disc spaces and vertebral body heights are preserved. Cervical
disc and neural foraminal pathology may be further evaluated by MRI
if indicated by neurologically localizing signs and symptoms.
